# Patient Record
Sex: Male | Born: 1950 | Race: White | Hispanic: No | Marital: Married | Smoking: Former smoker
Health system: Southern US, Community
[De-identification: ages and names within clinical notes are randomized; demographics above are authoritative.]

## PROBLEM LIST (undated history)

## (undated) DIAGNOSIS — J3489 Other specified disorders of nose and nasal sinuses: Secondary | ICD-10-CM

## (undated) DIAGNOSIS — R972 Elevated prostate specific antigen [PSA]: Secondary | ICD-10-CM

## (undated) DIAGNOSIS — J189 Pneumonia, unspecified organism: Secondary | ICD-10-CM

## (undated) DIAGNOSIS — M549 Dorsalgia, unspecified: Secondary | ICD-10-CM

## (undated) DIAGNOSIS — G4730 Sleep apnea, unspecified: Secondary | ICD-10-CM

## (undated) DIAGNOSIS — I219 Acute myocardial infarction, unspecified: Secondary | ICD-10-CM

## (undated) DIAGNOSIS — M1990 Unspecified osteoarthritis, unspecified site: Secondary | ICD-10-CM

## (undated) DIAGNOSIS — G8929 Other chronic pain: Secondary | ICD-10-CM

## (undated) DIAGNOSIS — L309 Dermatitis, unspecified: Secondary | ICD-10-CM

## (undated) DIAGNOSIS — K219 Gastro-esophageal reflux disease without esophagitis: Secondary | ICD-10-CM

## (undated) DIAGNOSIS — I10 Essential (primary) hypertension: Secondary | ICD-10-CM

## (undated) DIAGNOSIS — E785 Hyperlipidemia, unspecified: Secondary | ICD-10-CM

## (undated) DIAGNOSIS — Z87898 Personal history of other specified conditions: Secondary | ICD-10-CM

## (undated) HISTORY — PX: BACK SURGERY: SHX140

## (undated) HISTORY — PX: OTHER SURGICAL HISTORY: SHX169

## (undated) HISTORY — DX: Essential (primary) hypertension: I10

## (undated) HISTORY — DX: Acute myocardial infarction, unspecified: I21.9

---

## 1993-08-21 HISTORY — PX: KNEE ARTHROSCOPY: SUR90

## 2010-11-27 ENCOUNTER — Emergency Department (INDEPENDENT_AMBULATORY_CARE_PROVIDER_SITE_OTHER): Payer: Managed Care, Other (non HMO)

## 2010-11-27 ENCOUNTER — Emergency Department (HOSPITAL_BASED_OUTPATIENT_CLINIC_OR_DEPARTMENT_OTHER)
Admission: EM | Admit: 2010-11-27 | Discharge: 2010-11-27 | Disposition: A | Discharge status: Home / Self Care | Payer: Managed Care, Other (non HMO) | Attending: Emergency Medicine | Admitting: Emergency Medicine

## 2010-11-27 DIAGNOSIS — R05 Cough: Secondary | ICD-10-CM | POA: Insufficient documentation

## 2010-11-27 DIAGNOSIS — R61 Generalized hyperhidrosis: Secondary | ICD-10-CM

## 2010-11-27 DIAGNOSIS — E7800 Pure hypercholesterolemia, unspecified: Secondary | ICD-10-CM | POA: Insufficient documentation

## 2010-11-27 DIAGNOSIS — R059 Cough, unspecified: Secondary | ICD-10-CM | POA: Insufficient documentation

## 2010-11-27 DIAGNOSIS — J40 Bronchitis, not specified as acute or chronic: Secondary | ICD-10-CM | POA: Insufficient documentation

## 2011-12-14 ENCOUNTER — Other Ambulatory Visit: Payer: Self-pay | Admitting: Otolaryngology

## 2011-12-14 ENCOUNTER — Encounter (HOSPITAL_COMMUNITY): Payer: Self-pay | Admitting: Pharmacy Technician

## 2011-12-15 ENCOUNTER — Other Ambulatory Visit (HOSPITAL_COMMUNITY): Payer: Managed Care, Other (non HMO)

## 2011-12-18 ENCOUNTER — Encounter (HOSPITAL_COMMUNITY)
Admission: RE | Admit: 2011-12-18 | Discharge: 2011-12-18 | Disposition: A | Discharge status: Home / Self Care | Payer: Managed Care, Other (non HMO) | Source: Ambulatory Visit | Attending: Otolaryngology | Admitting: Otolaryngology

## 2011-12-18 ENCOUNTER — Encounter (HOSPITAL_COMMUNITY): Payer: Self-pay

## 2011-12-18 ENCOUNTER — Ambulatory Visit (HOSPITAL_COMMUNITY)
Admission: RE | Admit: 2011-12-18 | Discharge: 2011-12-18 | Disposition: A | Discharge status: Home / Self Care | Payer: Managed Care, Other (non HMO) | Source: Ambulatory Visit | Attending: Anesthesiology | Admitting: Anesthesiology

## 2011-12-18 DIAGNOSIS — Z01818 Encounter for other preprocedural examination: Secondary | ICD-10-CM | POA: Insufficient documentation

## 2011-12-18 DIAGNOSIS — I517 Cardiomegaly: Secondary | ICD-10-CM | POA: Insufficient documentation

## 2011-12-18 DIAGNOSIS — Z01810 Encounter for preprocedural cardiovascular examination: Secondary | ICD-10-CM | POA: Insufficient documentation

## 2011-12-18 DIAGNOSIS — Z01812 Encounter for preprocedural laboratory examination: Secondary | ICD-10-CM | POA: Insufficient documentation

## 2011-12-18 HISTORY — DX: Sleep apnea, unspecified: G47.30

## 2011-12-18 HISTORY — DX: Pneumonia, unspecified organism: J18.9

## 2011-12-18 HISTORY — DX: Unspecified osteoarthritis, unspecified site: M19.90

## 2011-12-18 HISTORY — DX: Dorsalgia, unspecified: M54.9

## 2011-12-18 HISTORY — DX: Other specified disorders of nose and nasal sinuses: J34.89

## 2011-12-18 HISTORY — DX: Elevated prostate specific antigen (PSA): R97.20

## 2011-12-18 HISTORY — DX: Gastro-esophageal reflux disease without esophagitis: K21.9

## 2011-12-18 HISTORY — DX: Other chronic pain: G89.29

## 2011-12-18 HISTORY — DX: Dermatitis, unspecified: L30.9

## 2011-12-18 HISTORY — DX: Hyperlipidemia, unspecified: E78.5

## 2011-12-18 HISTORY — DX: Personal history of other specified conditions: Z87.898

## 2011-12-18 LAB — CBC
Platelets: 189 10*3/uL (ref 150–400)
RBC: 5.33 MIL/uL (ref 4.22–5.81)
WBC: 5.7 10*3/uL (ref 4.0–10.5)

## 2011-12-18 LAB — BASIC METABOLIC PANEL
GFR calc Af Amer: >90 mL/min (ref >90)
GFR calc non Af Amer: 89 mL/min — ABNORMAL LOW (ref >90)
Potassium: 4.4 mEq/L (ref 3.5–5.1)
Sodium: 141 mEq/L (ref 135–145)

## 2011-12-18 LAB — SURGICAL PCR SCREEN: MRSA, PCR: NEGATIVE

## 2011-12-18 NOTE — Pre-Procedure Instructions (Signed)
20 Tyrone Terry  12/18/2011   Your procedure is scheduled on:  Fri, May 3 @ 0730 AM  Report to [facility] at 0530 AM.  Call this number if you have problems the morning of surgery: [phone]   Remember:   Do not eat food:After Midnight.  May have clear liquids: up to 4 Hours before arrival.(until 1:30 am)  Clear liquids include soda, tea, black coffee, apple or grape juice, broth,water  Take these medicines the morning of surgery with A SIP OF WATER: Protonix and Flonase   Do not wear jewelry, make-up or nail polish.  Do not wear lotions, powders, or perfumes.  \ Do not bring valuables to the hospital.  Contacts, dentures or bridgework may not be worn into surgery.  Leave suitcase in the car. After surgery it may be brought to your room.  For patients admitted to the hospital, checkout time is 11:00 AM the day of discharge.   Patients discharged the day of surgery will not be allowed to drive home.  Please read over the following fact sheets that you were given: Pain Booklet, Coughing and Deep Breathing, MRSA Information and Surgical Site Infection Prevention

## 2011-12-18 NOTE — Progress Notes (Signed)
Pt doesn't have a cardiologist  Echo/Stress test done >37yrs ago and both were normal;was just told to loose weight  Denies ever having a heart cath

## 2011-12-18 NOTE — Progress Notes (Signed)
Sleep study done @ [facility] on [address] in [place]-report requested;pt has not heard back yet as to whether he will need CPAP

## 2011-12-19 NOTE — Consult Note (Signed)
Anesthesia Chart Review:  Patient is a 61 year old male scheduled for a nasal septoplasty and bilateral inferior turbinate reduction on 12/22/11.  History includes former smoker, HLD, migraines, chronic back pain, PNA, OSA, arthritis, eczema, elevated PSA, GERD, obesity with BMI 42.7.    Labs noted.    CXR on 12/18/11 showed lungs clear. Mediastinal contours are stable. The heart is mildly enlarged and stable. No acute bony abnormality is seen.  Sleep study on 11/29/11 at [facility] shoed mild OSA and severe snoring.  EKG from 12/18/11 showed NSR, incomplete right BBB, borderline LAD.  Currently there are no comparison EKGs available in [facility].  He reported a normal stress and echo > 5 years ago.  No CV symptoms were documented at his PAT visit. He is currently followed at [facility].  I called them, and they do no have any prior EKGs on file.  If remains asymptomatic, then anticipate he can proceed as planned.  Shonna Chock, PA-C

## 2011-12-21 NOTE — Progress Notes (Addendum)
Attempted to call pt re: surgery time change.  Left message on answering machine for pt to be here at 0730 and that procedure would be at 0930.  Attempted to call pt at work- unable to reach.//L. Delainee Tramel,RN

## 2011-12-22 ENCOUNTER — Encounter (HOSPITAL_COMMUNITY): Payer: Self-pay | Admitting: Vascular Surgery

## 2011-12-22 ENCOUNTER — Encounter (HOSPITAL_COMMUNITY)
Admission: RE | Disposition: A | Discharge status: Home / Self Care | Payer: Self-pay | Source: Ambulatory Visit | Attending: Otolaryngology

## 2011-12-22 ENCOUNTER — Encounter (HOSPITAL_COMMUNITY): Payer: Self-pay | Admitting: General Practice

## 2011-12-22 ENCOUNTER — Ambulatory Visit (HOSPITAL_COMMUNITY)
Admission: RE | Admit: 2011-12-22 | Discharge: 2011-12-22 | Disposition: A | Discharge status: Home / Self Care | Payer: Managed Care, Other (non HMO) | Source: Ambulatory Visit | Attending: Otolaryngology | Admitting: Otolaryngology

## 2011-12-22 ENCOUNTER — Ambulatory Visit (HOSPITAL_COMMUNITY): Payer: Managed Care, Other (non HMO) | Admitting: Vascular Surgery

## 2011-12-22 ENCOUNTER — Encounter (HOSPITAL_COMMUNITY): Payer: Self-pay | Admitting: Otolaryngology

## 2011-12-22 DIAGNOSIS — K219 Gastro-esophageal reflux disease without esophagitis: Secondary | ICD-10-CM | POA: Insufficient documentation

## 2011-12-22 DIAGNOSIS — J342 Deviated nasal septum: Secondary | ICD-10-CM | POA: Insufficient documentation

## 2011-12-22 DIAGNOSIS — E785 Hyperlipidemia, unspecified: Secondary | ICD-10-CM | POA: Insufficient documentation

## 2011-12-22 DIAGNOSIS — J343 Hypertrophy of nasal turbinates: Secondary | ICD-10-CM | POA: Insufficient documentation

## 2011-12-22 DIAGNOSIS — Z01812 Encounter for preprocedural laboratory examination: Secondary | ICD-10-CM | POA: Insufficient documentation

## 2011-12-22 DIAGNOSIS — G4733 Obstructive sleep apnea (adult) (pediatric): Secondary | ICD-10-CM | POA: Insufficient documentation

## 2011-12-22 HISTORY — PX: NASAL SEPTUM SURGERY: SHX37

## 2011-12-22 HISTORY — PX: NASAL SEPTOPLASTY W/ TURBINOPLASTY: SHX2070

## 2011-12-22 SURGERY — SEPTOPLASTY, NOSE, WITH NASAL TURBINATE REDUCTION
Anesthesia: General | Site: Nose | Laterality: Bilateral | Wound class: Clean Contaminated

## 2011-12-22 MED ORDER — LACTATED RINGERS IV SOLN
INTRAVENOUS | Status: DC
  Administered 2011-12-22: 09:00:00 via INTRAVENOUS

## 2011-12-22 MED ORDER — DROPERIDOL 2.5 MG/ML IJ SOLN: 0.6250 mg | INTRAMUSCULAR | Status: DC | PRN

## 2011-12-22 MED ORDER — DEXTROSE IN LACTATED RINGERS 5 % IV SOLN
INTRAVENOUS | Status: DC
  Administered 2011-12-22: 13:00:00 via INTRAVENOUS

## 2011-12-22 MED ORDER — PROPOFOL 10 MG/ML IV EMUL
INTRAVENOUS | Status: DC | PRN
  Administered 2011-12-22: 200 mg via INTRAVENOUS

## 2011-12-22 MED ORDER — 0.9 % SODIUM CHLORIDE (POUR BTL) OPTIME
TOPICAL | Status: DC | PRN
  Administered 2011-12-22: 1000 mL

## 2011-12-22 MED ORDER — PANTOPRAZOLE SODIUM 40 MG PO TBEC
40.0000 mg | DELAYED_RELEASE_TABLET | Freq: Every day | ORAL | Status: DC
  Administered 2011-12-22: 40 mg via ORAL
  Filled 2011-12-22: qty 1

## 2011-12-22 MED ORDER — CEFAZOLIN SODIUM-DEXTROSE 2-3 GM-% IV SOLR
2.0000 g | INTRAVENOUS | Status: DC
  Filled 2011-12-22: qty 50

## 2011-12-22 MED ORDER — AMOXICILLIN-POT CLAVULANATE 500-125 MG PO TABS: 1.0000 | ORAL_TABLET | Freq: Two times a day (BID) | ORAL | Status: AC

## 2011-12-22 MED ORDER — SUCCINYLCHOLINE CHLORIDE 20 MG/ML IJ SOLN
INTRAMUSCULAR | Status: DC | PRN
  Administered 2011-12-22: 140 mg via INTRAVENOUS

## 2011-12-22 MED ORDER — ONDANSETRON HCL 4 MG/2ML IJ SOLN: 4.0000 mg | INTRAMUSCULAR | Status: DC | PRN

## 2011-12-22 MED ORDER — MORPHINE SULFATE 2 MG/ML IJ SOLN: 2.0000 mg | INTRAMUSCULAR | Status: DC | PRN

## 2011-12-22 MED ORDER — DEXAMETHASONE SODIUM PHOSPHATE 4 MG/ML IJ SOLN
INTRAMUSCULAR | Status: DC | PRN
  Administered 2011-12-22: 10 mg via INTRAVENOUS

## 2011-12-22 MED ORDER — MIDAZOLAM HCL 5 MG/5ML IJ SOLN
INTRAMUSCULAR | Status: DC | PRN
  Administered 2011-12-22: 2 mg via INTRAVENOUS

## 2011-12-22 MED ORDER — ACETAMINOPHEN 160 MG/5ML PO SOLN: 650.0000 mg | ORAL | Status: DC | PRN

## 2011-12-22 MED ORDER — EPHEDRINE SULFATE 50 MG/ML IJ SOLN
INTRAMUSCULAR | Status: DC | PRN
  Administered 2011-12-22: 5 mg via INTRAVENOUS

## 2011-12-22 MED ORDER — LIDOCAINE-EPINEPHRINE 1 %-1:100000 IJ SOLN
INTRAMUSCULAR | Status: DC | PRN
  Administered 2011-12-22: 20 mL

## 2011-12-22 MED ORDER — ACETAMINOPHEN 650 MG RE SUPP: 650.0000 mg | RECTAL | Status: DC | PRN

## 2011-12-22 MED ORDER — ONDANSETRON HCL 4 MG/2ML IJ SOLN
INTRAMUSCULAR | Status: DC | PRN
  Administered 2011-12-22: 4 mg via INTRAVENOUS

## 2011-12-22 MED ORDER — HYDROCODONE-ACETAMINOPHEN 5-325 MG PO TABS: 1.0000 | ORAL_TABLET | Freq: Four times a day (QID) | ORAL | Status: AC | PRN

## 2011-12-22 MED ORDER — HYDROMORPHONE HCL PF 1 MG/ML IJ SOLN
0.2500 mg | INTRAMUSCULAR | Status: DC | PRN
  Administered 2011-12-22 (×2): 0.5 mg via INTRAVENOUS

## 2011-12-22 MED ORDER — MUPIROCIN CALCIUM 2 % EX CREA
TOPICAL_CREAM | CUTANEOUS | Status: DC | PRN
  Administered 2011-12-22: 1 via TOPICAL

## 2011-12-22 MED ORDER — OXYMETAZOLINE HCL 0.05 % NA SOLN
NASAL | Status: DC | PRN
  Administered 2011-12-22: 1

## 2011-12-22 MED ORDER — DEXAMETHASONE SODIUM PHOSPHATE 10 MG/ML IJ SOLN
10.0000 mg | Freq: Once | INTRAMUSCULAR | Status: DC
  Filled 2011-12-22: qty 1

## 2011-12-22 MED ORDER — HYDROCODONE-ACETAMINOPHEN 5-325 MG PO TABS: 1.0000 | ORAL_TABLET | ORAL | Status: DC | PRN

## 2011-12-22 MED ORDER — LACTATED RINGERS IV SOLN
INTRAVENOUS | Status: DC | PRN
  Administered 2011-12-22: 09:00:00 via INTRAVENOUS

## 2011-12-22 MED ORDER — ONDANSETRON HCL 4 MG PO TABS: 4.0000 mg | ORAL_TABLET | ORAL | Status: DC | PRN

## 2011-12-22 MED ORDER — FENTANYL CITRATE 0.05 MG/ML IJ SOLN
INTRAMUSCULAR | Status: DC | PRN
  Administered 2011-12-22: 150 ug via INTRAVENOUS

## 2011-12-22 MED ORDER — CEFAZOLIN SODIUM 1-5 GM-% IV SOLN: 1.0000 g | INTRAVENOUS | Status: DC

## 2011-12-22 SURGICAL SUPPLY — 30 items
CANISTER SUCTION 2500CC (MISCELLANEOUS) ×2 IMPLANT
CLOTH BEACON ORANGE TIMEOUT ST (SAFETY) ×2 IMPLANT
COAGULATOR SUCT SWTCH 10FR 6 (ELECTROSURGICAL) IMPLANT
COVER MAYO STAND STRL (DRAPES) ×2 IMPLANT
ELECT LOOP CUT MONO 26F .012 R (MISCELLANEOUS) ×2 IMPLANT
ELECT REM PT RETURN 9FT ADLT (ELECTROSURGICAL)
ELECTRODE REM PT RTRN 9FT ADLT (ELECTROSURGICAL) IMPLANT
GAUZE SPONGE 2X2 8PLY STRL LF (GAUZE/BANDAGES/DRESSINGS) ×1 IMPLANT
GLOVE BIOGEL M 7.0 STRL (GLOVE) ×4 IMPLANT
GLOVE SURG SS PI 6.5 STRL IVOR (GLOVE) ×2 IMPLANT
GOWN STRL NON-REIN LRG LVL3 (GOWN DISPOSABLE) ×6 IMPLANT
KIT BASIN OR (CUSTOM PROCEDURE TRAY) ×2 IMPLANT
KIT ROOM TURNOVER OR (KITS) ×2 IMPLANT
NEEDLE HYPO 25GX1X1/2 BEV (NEEDLE) ×2 IMPLANT
NS IRRIG 1000ML POUR BTL (IV SOLUTION) ×2 IMPLANT
PAD ARMBOARD 7.5X6 YLW CONV (MISCELLANEOUS) ×4 IMPLANT
SPLINT NASAL DOYLE BI-VL (GAUZE/BANDAGES/DRESSINGS) ×2 IMPLANT
SPONGE GAUZE 2X2 STER 10/PKG (GAUZE/BANDAGES/DRESSINGS) ×1
SPONGE NEURO XRAY DETECT 1X3 (DISPOSABLE) ×2 IMPLANT
SUT CHROMIC 4 0 P 3 18 (SUTURE) ×2 IMPLANT
SUT ETHILON 3 0 PS 1 (SUTURE) ×2 IMPLANT
SUT PLAIN 4 0 ~~LOC~~ 1 (SUTURE) ×2 IMPLANT
TAPE SURG TRANSPORE 1 IN (GAUZE/BANDAGES/DRESSINGS) ×1 IMPLANT
TAPE SURGICAL TRANSPORE 1 IN (GAUZE/BANDAGES/DRESSINGS) ×1
TOWEL OR 17X24 6PK STRL BLUE (TOWEL DISPOSABLE) ×2 IMPLANT
TOWEL OR 17X26 10 PK STRL BLUE (TOWEL DISPOSABLE) ×2 IMPLANT
TRAY ENT MC OR (CUSTOM PROCEDURE TRAY) ×2 IMPLANT
TUBE SALEM SUMP 16 FR W/ARV (TUBING) ×2 IMPLANT
TUBING EXTENTION W/L.L. (IV SETS) ×2 IMPLANT
WATER STERILE IRR 1000ML POUR (IV SOLUTION) IMPLANT

## 2011-12-22 NOTE — Discharge Planning (Signed)
D'cd per w/c acomp. By wife with all personal belongings and copy of home instructions and prescritions.

## 2011-12-22 NOTE — H&P (Signed)
Tyrone Terry is an 61 y.o. male.   Chief Complaint: Nasal airway obstruction HPI: Mild OSA, progressive nasal obstruction  Past Medical History  Diagnosis Date  . Hyperlipidemia     takes [place] daily  . Pneumonia     hx of;last time about 3-72yrs ago  . Sinus drainage     uses Flonase nightly as well as saline spray  . Sleep apnea     has not gotten report back from this  . Migraine     hx of;last time a couple of yrs ago  . Arthritis     back   . Chronic back pain     degenerative disc disease  . Eczema   . GERD (gastroesophageal reflux disease)     takes Protonix daily  . Hemorrhoids   . Elevated PSA     sees urologist  . H/O urinary frequency     Past Surgical History  Procedure Date  . Knee arthroscopy 1995    left  . Colonosocpy     Family History  Problem Relation Age of Onset  . Anesthesia problems Neg Hx   . Hypotension Neg Hx   . Malignant hyperthermia Neg Hx   . Pseudochol deficiency Neg Hx    Social History:  reports that he has quit smoking. He does not have any smokeless tobacco history on file. He reports that he drinks alcohol. He reports that he does not use illicit drugs.  Allergies:  Allergies  Allergen Reactions  . Statins Other (See Comments)    Muscle cramps    Medications Prior to Admission  Medication Sig Dispense Refill  . Acetaminophen (TYLENOL PO) Take 1 tablet by mouth every 6 (six) hours as needed. For pain      . B Complex-C (B-COMPLEX WITH VITAMIN C) tablet Take 1 tablet by mouth daily.      . Cholecalciferol (VITAMIN D3) 3000 UNITS TABS Take 1 tablet by mouth daily.      . fluticasone (FLONASE) 50 MCG/ACT nasal spray Place 2 sprays into the nose at bedtime.      . Omega-3 Krill Oil 500 MG CAPS Take 1 capsule by mouth daily.      . pantoprazole (PROTONIX) 40 MG tablet Take 40 mg by mouth daily.      . Red Yeast Rice 600 MG CAPS Take 1 capsule by mouth daily.      . sodium chloride (OCEAN) 0.65 % SOLN nasal spray Place  1 spray into the nose every evening.        No results found for this or any previous visit (from the past 48 hour(s)). No results found.  Review of Systems  Constitutional: Negative.   Respiratory: Negative.   Cardiovascular: Negative.   Gastrointestinal: Negative.   Musculoskeletal: Negative.   Skin: Negative.   Neurological: Negative.     There were no vitals taken for this visit. Physical Exam  Constitutional: He is oriented to person, place, and time. He appears well-developed and well-nourished.  HENT:  Nose: Septal deviation present.  Mouth/Throat: Uvula is midline, oropharynx is clear and moist and mucous membranes are normal.  Neck: Normal range of motion. Neck supple.  Cardiovascular: Normal rate and regular rhythm.   Respiratory: Effort normal and breath sounds normal.  GI: Soft.  Musculoskeletal: Normal range of motion.  Neurological: He is alert and oriented to person, place, and time.     Assessment/Plan ADM for nasal septoplasty and turbinate reduction  Britiany Silbernagel 12/22/2011,  7:41 AM

## 2011-12-22 NOTE — Brief Op Note (Signed)
12/22/2011  10:17 AM  PATIENT:  Tyrone Terry  61 y.o. male  PRE-OPERATIVE DIAGNOSIS:  DEVIATED SEPTUM  POST-OPERATIVE DIAGNOSIS:  DEVIATED SEPTUM  PROCEDURE:  Procedure(s) (LRB): NASAL SEPTOPLASTY WITH TURBINATE REDUCTION (Bilateral)  SURGEON:  Surgeon(s) and Role:    * Osborn Coho, MD - Primary  PHYSICIAN ASSISTANT:   ASSISTANTS: none   ANESTHESIA:   general  EBL:   <50  BLOOD ADMINISTERED:none  DRAINS: none   LOCAL MEDICATIONS USED:  LIDOCAINE  and Amount: 8 ml  SPECIMEN:  No Specimen  DISPOSITION OF SPECIMEN:  N/A  COUNTS:  YES  TOURNIQUET:  * No tourniquets in log *  DICTATION: .Other Dictation: Dictation Number [id number]  PLAN OF CARE: Admit for overnight observation  PATIENT DISPOSITION:  PACU - hemodynamically stable.   Delay start of Pharmacological VTE agent (>24hrs) due to surgical blood loss or risk of bleeding: yes

## 2011-12-22 NOTE — Transfer of Care (Signed)
Immediate Anesthesia Transfer of Care Note  Patient: Tyrone Terry  Procedure(s) Performed: Procedure(s) (LRB): NASAL SEPTOPLASTY WITH TURBINATE REDUCTION (Bilateral)  Patient Location: PACU  Anesthesia Type: General  Level of Consciousness: awake and alert   Airway & Oxygen Therapy: Patient Spontanous Breathing and Patient connected to nasal cannula oxygen  Post-op Assessment: Report given to PACU RN and Post -op Vital signs reviewed and stable  Post vital signs: Reviewed and stable  Complications: No apparent anesthesia complications

## 2011-12-22 NOTE — Discharge Summary (Signed)
Physician Discharge Summary  Patient ID: AIVEN KAMPE MRN: [id number] DOB/AGE: Sep 28, 1950 61 y.o.  Admit date: 12/22/2011 Discharge date: 12/22/2011  Admission Diagnoses: Septal deviation  Discharge Diagnoses:  Principal Problem:  *Nasal septal deviation   Discharged Condition: good  Hospital Course: 61 year old male with mild sleep apnea who underwent nasal septoplasty and turbinate reduction earlier today.  Doing well after surgery with only mild pain and decreasing nasal bleeding.  Stable for discharge.  Consults: None  Significant Diagnostic Studies: None.  Treatments: surgery: Nasal septoplasty and turbinate reduction.  Discharge Exam: Blood pressure 129/77, pulse 99, temperature 98.2 F (36.8 C), temperature source Oral, resp. rate 18, height 5\' 6"  (1.676 m), weight 120 kg (264 lb 8.8 oz), SpO2 93.00%. General appearance: alert, cooperative and no distress Nose: Nasal drip pad in place.  No active bleeding.  Disposition: 01-Home or Self Care  Discharge Orders    Future Orders Please Complete By Expires   Diet - low sodium heart healthy      Diet - low sodium heart healthy      Increase activity slowly      Discharge instructions      Comments:   1. Limited activity 2. Liquid and soft diet 3. May bathe and shower 4. Saline nasal spray - 4 puffs/nostril every hour while awake 5. Elevate Head of Bed 6. No nose blowing   Increase activity slowly      Discharge instructions      Comments:   Avoid nose blowing.  Keep head elevated.  Use saline spray in each nostril each 2-4 hours.  Change drip pad as needed.  Keep activity level limited.     Medication List  As of 12/22/2011  4:42 PM   STOP taking these medications         fluticasone 50 MCG/ACT nasal spray         TAKE these medications         amoxicillin-clavulanate 500-125 MG per tablet   Commonly known as: AUGMENTIN   Take 1 tablet (500 mg total) by mouth 2 (two) times daily.      B-complex with  vitamin C tablet   Take 1 tablet by mouth daily.      HYDROcodone-acetaminophen 5-325 MG per tablet   Commonly known as: NORCO   Take 1-2 tablets by mouth every 6 (six) hours as needed for pain.      Omega-3 Krill Oil 500 MG Caps   Take 1 capsule by mouth daily.      pantoprazole 40 MG tablet   Commonly known as: PROTONIX   Take 40 mg by mouth daily.      Red Yeast Rice 600 MG Caps   Take 1 capsule by mouth daily.      sodium chloride 0.65 % Soln nasal spray   Commonly known as: OCEAN   Place 1 spray into the nose every evening.      TYLENOL PO   Take 1 tablet by mouth every 6 (six) hours as needed. For pain      Vitamin D3 3000 UNITS Tabs   Take 1 tablet by mouth daily.           Follow-up Information    Follow up with SHOEMAKER, DAVID, MD in 2 weeks.   Contact information:   [address], [address] [place] [address] [phone]          Signed: Jenne Pane, Sedra Morfin  12/22/2011, 4:42 PM

## 2011-12-22 NOTE — Op Note (Signed)
Tyrone Terry, Tyrone Terry            ACCOUNT NO.:  [id number]  MEDICAL RECORD NO.:  [id number]  LOCATION:  [facility]                         FACILITY:  [facility]  PHYSICIAN:  Kinnie Scales. Annalee Genta, M.D.DATE OF BIRTH:  09-Sep-1950  DATE OF PROCEDURE:  12/22/2011 DATE OF DISCHARGE:                              OPERATIVE REPORT   LOCATION:  [facility] Main OR.  PREOPERATIVE DIAGNOSES: 1. Deviated nasal septum with airway obstruction. 2. Inferior turbinate hypertrophy. 3. Obstructive sleep apnea.  POSTOPERATIVE DIAGNOSES: 1. Deviated nasal septum with airway obstruction. 2. Inferior turbinate hypertrophy. 3. Obstructive sleep apnea.  INDICATION FOR SURGERY: 1. Deviated nasal septum with airway obstruction. 2. Inferior turbinate hypertrophy. 3. Obstructive sleep apnea.  SURGICAL PROCEDURE PERFORMED: 1. Nasal septoplasty. 2. Inferior turbinate reduction.  ANESTHESIA:  General endotracheal.  COMPLICATIONS:  None.  ESTIMATED BLOOD LOSS:  Less than 50 mL.  The patient was transferred from the operating room to the recovery room in stable condition.  BRIEF HISTORY:  The patient is a 61 year old, white male, referred to our office for evaluation of progressive symptoms of nasal airway obstruction.  He suffered a previous nasal injury without treatment several decades ago, and since that time, he has noted increasing problems with nasal breathing, particularly on the left-hand side. Examination in the office showed a severely deviated nasal septum with near complete left-sided nasal airway obstruction and bilateral turbinate hypertrophy.  The patient also had significant nighttime snoring and concerns with sleep apnea.  A sleep study was performed, and the patient was found to have mild sleep apnea with an AHI of 10.7 events per hour.  Given the patient's history, examination, and physical findings, I recommended to undertake nasal septoplasty and inferior turbinate reduction.  The  risks and benefits of the procedure were discussed in detail with the patient and his family.  They understood and concurred with our plan for surgery which is scheduled as an outpatient under general anesthesia at [facility] Main OR.  PROCEDURE:  The patient was brought to the operating room, placed in supine position on the operating table.  General endotracheal anesthesia was established without difficulty, and when he was adequately anesthetized, he was positioned and then prepped and draped in a sterile fashion.  His nose was injected with a total of 8 mL of 1% lidocaine 1:100,000 solution of epinephrine was injected in submucosal fashion along the nasal septum and inferior turbinates bilaterally.  The patient's nose was then packed with Afrin-soaked cottonoid pledgets, were left in place for approximately 10 minutes to allow for vasoconstriction.  The patient was positioned, prepped and draped. Procedure was begun.  A left anterior hemitransfixion incision was created and mucoperichondrial flap was elevated on the left-hand side. Cartilaginous septum was crossed anteriorly and a mucoperichondrial flap was elevated on the patient's right.  He had a severely deviated nasal septum with significant traumatic changes and a large inferior septal spur which was mobilized preserving the overlying mucosa.  The septum was brought to a good midline position and mid and posterior septal cartilage and bone were resected.  A small amount of septal cartilage was morselized and returned to the mucoperichondrial pocket.  Mucosal flaps were reapproximated with a 4-0  gut suture on a Keith needle in a horizontal mattressing fashion.  Bilateral Doyle nasal septal splints were placed after the application of Bactroban ointment, and the splints were sutured in position with a 3-0 Ethilon suture.  Inferior turbinate reduction was also performed with bipolar cautery set at 12 watts, 2  submucosal passes were made in each inferior turbinate. When the turbinate has been adequately cauterized, an anterior incision was created overlying soft tissue elevated and the turbinates were then outfractured creating more patent nasal cavity.  Nasal cavity was irrigated and suctioned, no bleeding.  Orogastric tube was passed.  Stomach contents were aspirated.  The patient was then awakened from his anesthetic, he was extubated and transferred from the operating room to the recovery room in stable condition.  No complications.  Blood loss less than 50 mL.          ______________________________ Kinnie Scales. Annalee Genta, M.D.     DLS/MEDQ  D:  16/05/9603  T:  12/22/2011  Job:  [id number]

## 2011-12-22 NOTE — Anesthesia Preprocedure Evaluation (Addendum)
Anesthesia Evaluation  Patient identified by MRN, date of birth, ID band Patient awake    Reviewed: Allergy & Precautions, H&P , NPO status , Patient's Chart, lab work & pertinent test results  History of Anesthesia Complications Negative for: history of anesthetic complications  Airway Mallampati: II TM Distance: >3 FB Neck ROM: Full    Dental  (+) Teeth Intact and Dental Advisory Given   Pulmonary sleep apnea ,  breath sounds clear to auscultation  Pulmonary exam normal       Cardiovascular negative cardio ROS  Rhythm:Regular Rate:Normal     Neuro/Psych negative psych ROS   GI/Hepatic Neg liver ROS, GERD-  Medicated and Controlled,  Endo/Other  Morbid obesity  Renal/GU negative Renal ROS     Musculoskeletal   Abdominal   Peds  Hematology   Anesthesia Other Findings   Reproductive/Obstetrics                           Anesthesia Physical Anesthesia Plan  ASA: III  Anesthesia Plan: General   Post-op Pain Management:    Induction: Intravenous  Airway Management Planned: Oral ETT  Additional Equipment:   Intra-op Plan:   Post-operative Plan:   Informed Consent: I have reviewed the patients History and Physical, chart, labs and discussed the procedure including the risks, benefits and alternatives for the proposed anesthesia with the patient or authorized representative who has indicated his/her understanding and acceptance.     Plan Discussed with: CRNA, Anesthesiologist and Surgeon  Anesthesia Plan Comments:         Anesthesia Quick Evaluation

## 2011-12-25 ENCOUNTER — Encounter (HOSPITAL_COMMUNITY): Payer: Self-pay | Admitting: Otolaryngology

## 2012-01-08 NOTE — Anesthesia Postprocedure Evaluation (Signed)
Anesthesia Post Note  Patient: Tyrone Terry  Procedure(s) Performed: Procedure(s) (LRB): NASAL SEPTOPLASTY WITH TURBINATE REDUCTION (Bilateral)  Anesthesia type: general  Patient location: PACU  Post pain: Pain level controlled  Post assessment: Patient's Cardiovascular Status Stable  Last Vitals:  Filed Vitals:   12/22/11 1402  BP: 129/77  Pulse: 99  Temp: 36.8 C  Resp: 18    Post vital signs: Reviewed and stable  Level of consciousness: sedated  Complications: No apparent anesthesia complications

## 2014-01-07 ENCOUNTER — Other Ambulatory Visit (HOSPITAL_BASED_OUTPATIENT_CLINIC_OR_DEPARTMENT_OTHER): Payer: Self-pay | Admitting: Sports Medicine

## 2014-01-07 DIAGNOSIS — M545 Low back pain, unspecified: Secondary | ICD-10-CM

## 2014-01-07 DIAGNOSIS — M47817 Spondylosis without myelopathy or radiculopathy, lumbosacral region: Secondary | ICD-10-CM

## 2014-01-07 DIAGNOSIS — M5137 Other intervertebral disc degeneration, lumbosacral region: Secondary | ICD-10-CM

## 2014-01-07 DIAGNOSIS — IMO0002 Reserved for concepts with insufficient information to code with codable children: Secondary | ICD-10-CM

## 2014-01-07 DIAGNOSIS — G4730 Sleep apnea, unspecified: Secondary | ICD-10-CM

## 2014-01-07 DIAGNOSIS — K219 Gastro-esophageal reflux disease without esophagitis: Secondary | ICD-10-CM

## 2014-01-10 ENCOUNTER — Ambulatory Visit (HOSPITAL_BASED_OUTPATIENT_CLINIC_OR_DEPARTMENT_OTHER): Payer: Managed Care, Other (non HMO)

## 2014-02-01 ENCOUNTER — Encounter (HOSPITAL_BASED_OUTPATIENT_CLINIC_OR_DEPARTMENT_OTHER): Payer: Self-pay | Admitting: Emergency Medicine

## 2014-02-01 ENCOUNTER — Emergency Department (HOSPITAL_BASED_OUTPATIENT_CLINIC_OR_DEPARTMENT_OTHER): Payer: Managed Care, Other (non HMO)

## 2014-02-01 ENCOUNTER — Emergency Department (HOSPITAL_BASED_OUTPATIENT_CLINIC_OR_DEPARTMENT_OTHER)
Admission: EM | Admit: 2014-02-01 | Discharge: 2014-02-01 | Disposition: A | Discharge status: Home / Self Care | Payer: Managed Care, Other (non HMO) | Attending: Emergency Medicine | Admitting: Emergency Medicine

## 2014-02-01 DIAGNOSIS — R079 Chest pain, unspecified: Secondary | ICD-10-CM

## 2014-02-01 DIAGNOSIS — R0789 Other chest pain: Secondary | ICD-10-CM | POA: Insufficient documentation

## 2014-02-01 DIAGNOSIS — K219 Gastro-esophageal reflux disease without esophagitis: Secondary | ICD-10-CM | POA: Insufficient documentation

## 2014-02-01 DIAGNOSIS — E7800 Pure hypercholesterolemia, unspecified: Secondary | ICD-10-CM | POA: Insufficient documentation

## 2014-02-01 DIAGNOSIS — M129 Arthropathy, unspecified: Secondary | ICD-10-CM | POA: Insufficient documentation

## 2014-02-01 DIAGNOSIS — R51 Headache: Secondary | ICD-10-CM | POA: Insufficient documentation

## 2014-02-01 DIAGNOSIS — R519 Headache, unspecified: Secondary | ICD-10-CM

## 2014-02-01 DIAGNOSIS — Z79899 Other long term (current) drug therapy: Secondary | ICD-10-CM | POA: Insufficient documentation

## 2014-02-01 DIAGNOSIS — Z87891 Personal history of nicotine dependence: Secondary | ICD-10-CM | POA: Insufficient documentation

## 2014-02-01 DIAGNOSIS — Z872 Personal history of diseases of the skin and subcutaneous tissue: Secondary | ICD-10-CM | POA: Insufficient documentation

## 2014-02-01 DIAGNOSIS — Z8701 Personal history of pneumonia (recurrent): Secondary | ICD-10-CM | POA: Insufficient documentation

## 2014-02-01 DIAGNOSIS — G43909 Migraine, unspecified, not intractable, without status migrainosus: Secondary | ICD-10-CM | POA: Insufficient documentation

## 2014-02-01 DIAGNOSIS — E785 Hyperlipidemia, unspecified: Secondary | ICD-10-CM | POA: Insufficient documentation

## 2014-02-01 DIAGNOSIS — N390 Urinary tract infection, site not specified: Secondary | ICD-10-CM | POA: Insufficient documentation

## 2014-02-01 DIAGNOSIS — Z791 Long term (current) use of non-steroidal anti-inflammatories (NSAID): Secondary | ICD-10-CM | POA: Insufficient documentation

## 2014-02-01 DIAGNOSIS — E669 Obesity, unspecified: Secondary | ICD-10-CM | POA: Insufficient documentation

## 2014-02-01 DIAGNOSIS — Z8679 Personal history of other diseases of the circulatory system: Secondary | ICD-10-CM | POA: Insufficient documentation

## 2014-02-01 DIAGNOSIS — G8929 Other chronic pain: Secondary | ICD-10-CM | POA: Insufficient documentation

## 2014-02-01 LAB — CBC WITH DIFFERENTIAL/PLATELET
BASOS PCT: 0 % (ref 0–1)
Basophils Absolute: 0 10*3/uL (ref 0.0–0.1)
EOS ABS: 0.1 10*3/uL (ref 0.0–0.7)
Eosinophils Relative: 0 % (ref 0–5)
HCT: 44.3 % (ref 39.0–52.0)
HEMOGLOBIN: 15.9 g/dL (ref 13.0–17.0)
Lymphocytes Relative: 7 % — ABNORMAL LOW (ref 12–46)
Lymphs Abs: 1.1 10*3/uL (ref 0.7–4.0)
MCH: 31.1 pg (ref 26.0–34.0)
MCHC: 35.9 g/dL (ref 30.0–36.0)
MCV: 86.7 fL (ref 78.0–100.0)
MONOS PCT: 8 % (ref 3–12)
Monocytes Absolute: 1.4 10*3/uL — ABNORMAL HIGH (ref 0.1–1.0)
NEUTROS ABS: 14 10*3/uL — AB (ref 1.7–7.7)
NEUTROS PCT: 85 % — AB (ref 43–77)
PLATELETS: 158 10*3/uL (ref 150–400)
RBC: 5.11 MIL/uL (ref 4.22–5.81)
RDW: 13.6 % (ref 11.5–15.5)
WBC: 16.5 10*3/uL — ABNORMAL HIGH (ref 4.0–10.5)

## 2014-02-01 LAB — URINALYSIS, ROUTINE W REFLEX MICROSCOPIC
Bilirubin Urine: NEGATIVE
GLUCOSE, UA: NEGATIVE mg/dL
Hgb urine dipstick: NEGATIVE
Ketones, ur: 15 mg/dL — AB
Nitrite: POSITIVE — AB
PH: 8 (ref 5.0–8.0)
Protein, ur: NEGATIVE mg/dL
SPECIFIC GRAVITY, URINE: 1.017 (ref 1.005–1.030)
Urobilinogen, UA: 0.2 mg/dL (ref 0.0–1.0)

## 2014-02-01 LAB — COMPREHENSIVE METABOLIC PANEL
ALT: 23 U/L (ref 0–53)
AST: 18 U/L (ref 0–37)
Albumin: 4 g/dL (ref 3.5–5.2)
Alkaline Phosphatase: 77 U/L (ref 39–117)
BUN: 12 mg/dL (ref 6–23)
CO2: 24 mEq/L (ref 19–32)
Calcium: 9.9 mg/dL (ref 8.4–10.5)
Chloride: 97 mEq/L (ref 96–112)
Creatinine, Ser: 0.8 mg/dL (ref 0.50–1.35)
GFR calc Af Amer: >90 mL/min (ref >90)
GFR calc non Af Amer: >90 mL/min (ref >90)
Glucose, Bld: 112 mg/dL — ABNORMAL HIGH (ref 70–99)
Potassium: 3.9 mEq/L (ref 3.7–5.3)
SODIUM: 136 meq/L — AB (ref 137–147)
TOTAL PROTEIN: 7.5 g/dL (ref 6.0–8.3)
Total Bilirubin: 1.8 mg/dL — ABNORMAL HIGH (ref 0.3–1.2)

## 2014-02-01 LAB — D-DIMER, QUANTITATIVE: D-Dimer, Quant: 0.36 ug/mL-FEU (ref 0.00–0.48)

## 2014-02-01 LAB — URINE MICROSCOPIC-ADD ON

## 2014-02-01 LAB — TROPONIN I

## 2014-02-01 MED ORDER — CEPHALEXIN 500 MG PO CAPS: 500.0000 mg | ORAL_CAPSULE | Freq: Four times a day (QID) | ORAL | Status: DC

## 2014-02-01 MED ORDER — DIPHENHYDRAMINE HCL 50 MG/ML IJ SOLN
25.0000 mg | Freq: Once | INTRAMUSCULAR | Status: AC
  Administered 2014-02-01: 25 mg via INTRAVENOUS
  Filled 2014-02-01: qty 1

## 2014-02-01 MED ORDER — DEXTROSE 5 % IV SOLN: 1.0000 g | Freq: Once | INTRAVENOUS | Status: DC

## 2014-02-01 MED ORDER — CEFTRIAXONE SODIUM 1 G IJ SOLR
1.0000 g | Freq: Once | INTRAMUSCULAR | Status: AC
Start: 2014-02-01 — End: 2014-02-01
  Administered 2014-02-01: 1 g via INTRAVENOUS

## 2014-02-01 MED ORDER — CEFTRIAXONE SODIUM 1 G IJ SOLR
INTRAMUSCULAR | Status: AC
  Filled 2014-02-01: qty 10

## 2014-02-01 MED ORDER — SODIUM CHLORIDE 0.9 % IV BOLUS (SEPSIS)
1000.0000 mL | INTRAVENOUS | Status: AC
  Administered 2014-02-01: 1000 mL via INTRAVENOUS

## 2014-02-01 MED ORDER — METOCLOPRAMIDE HCL 5 MG/ML IJ SOLN
10.0000 mg | Freq: Once | INTRAMUSCULAR | Status: AC
  Administered 2014-02-01: 10 mg via INTRAVENOUS
  Filled 2014-02-01: qty 2

## 2014-02-01 MED ORDER — ACETAMINOPHEN 325 MG PO TABS
650.0000 mg | ORAL_TABLET | Freq: Once | ORAL | Status: AC
  Administered 2014-02-01: 650 mg via ORAL
  Filled 2014-02-01: qty 2

## 2014-02-01 NOTE — ED Provider Notes (Signed)
CSN: [id number]     Arrival date & time 02/01/14  1420 History   This chart was scribed for Junius ArgyleForrest S Placido Hangartner, MD by Quintella ReichertMatthew Underwood, ED scribe.  This patient was seen in room MH05/MH05 and the patient's care was started at 3:39 PM.     Chief Complaint  Patient presents with  . Chest Pain    Patient is a 63 y.o. male presenting with chest pain. The history is provided by the patient. No language interpreter was used.  Chest Pain Pain quality: pressure   Pain radiates to:  Upper back Pain radiates to the back: yes   Pain severity:  Moderate Duration:  17 hours Timing:  Intermittent Progression:  Resolved Chronicity:  New Context: at rest   Worsened by:  Deep breathing Associated symptoms: fever, headache and nausea   Associated symptoms: no abdominal pain, no back pain, no cough, no shortness of breath and not vomiting   Risk factors: high cholesterol, male sex and obesity   Risk factors: no coronary artery disease, no diabetes mellitus, no hypertension and no prior DVT/PE    HPI Comments: Tyrone Terry is a 63 y.o. male with a history of migraines who presents to the Emergency Department complaining of a gradual onset headache beginning yesterday. Patient reports that he was sitting at home when the pain began and currently rates the pain as a 10/10 in severity. Describes pain as dull and throbbing at the left occipital region. He has taken two OTC ibuprofen for the pain today without relief. He states that this headache feels similar to previous migraines. He also reports associated nausea, but denies vomiting.   He also reports a sharp pain at the end of his penis while urinating yesterday and continued discomfort while urinating today. He denies any pain when not urinating. He denies associated abdominal pain. He states that his urine was a dark yellow.  He believes that he passed a kidney stone.   Patient also reports chest pain that radiates to his upper back. The pain  began last night around 11:30 PM while he was laying in bed. He is not currently experiencing any pain. He described the pain as a pressure that is exacerbated by breathing. He reports that the pain was intermittent and lasted for a couple minutes at a time, with episodes occurring approximately every hour. Last night, the patient went out to dinner and reports eating more than he normally eats. He also reports feeling generally ill since waking this morning. Patient has a history of hyperlipidemia but denies denies HTN or DM. He has a history of smoking but quit 27 years ago. He denies recent surgery. He drinks alcohol rarely.   Past Medical History  Diagnosis Date  . Hyperlipidemia     takes [place] Oil daily  . Pneumonia     hx of;last time about 3-6619yrs ago  . Sinus drainage     uses Flonase nightly as well as saline spray  . Sleep apnea     has not gotten report back from this  . Migraine     hx of;last time a couple of yrs ago  . Arthritis     back   . Chronic back pain     degenerative disc disease  . Eczema   . GERD (gastroesophageal reflux disease)     takes Protonix daily  . Hemorrhoids   . Elevated PSA     sees urologist  . H/O urinary frequency    Past  Surgical History  Procedure Laterality Date  . Knee arthroscopy  1995    left  . Colonosocpy    . Nasal septum surgery  12/22/2011  . Nasal septoplasty w/ turbinoplasty  12/22/2011    Procedure: NASAL SEPTOPLASTY WITH TURBINATE REDUCTION;  Surgeon: Osborn Coho, MD;  Location: [facility] OR;  Service: ENT;  Laterality: Bilateral;   Family History  Problem Relation Age of Onset  . Anesthesia problems Neg Hx   . Hypotension Neg Hx   . Malignant hyperthermia Neg Hx   . Pseudochol deficiency Neg Hx    History  Substance Use Topics  . Smoking status: Former Games developer  . Smokeless tobacco: Never Used     Comment: quit 51yrs ago  . Alcohol Use: Yes     Comment: 3 days/wk    Review of Systems  Constitutional: Positive for  fever. Negative for chills.  HENT: Negative for congestion, rhinorrhea and sore throat.   Eyes: Negative for visual disturbance.  Respiratory: Negative for cough and shortness of breath.   Cardiovascular: Positive for chest pain. Negative for leg swelling.  Gastrointestinal: Positive for nausea. Negative for vomiting and abdominal pain.  Genitourinary: Positive for dysuria.  Musculoskeletal: Negative for back pain and neck pain.  Skin: Negative for rash.  Neurological: Positive for headaches.  Hematological: Does not bruise/bleed easily.  Psychiatric/Behavioral: Negative for confusion.      Allergies  Statins  Home Medications   Prior to Admission medications   Medication Sig Start Date End Date Taking? Authorizing Provider  gabapentin (NEURONTIN) 300 MG capsule Take 300 mg by mouth 3 (three) times daily.   Yes Historical Provider, MD  meloxicam (MOBIC) 15 MG tablet Take 15 mg by mouth daily.   Yes Historical Provider, MD  Acetaminophen (TYLENOL PO) Take 1 tablet by mouth every 6 (six) hours as needed. For pain    Historical Provider, MD  B Complex-C (B-COMPLEX WITH VITAMIN C) tablet Take 1 tablet by mouth daily.    Historical Provider, MD  Cholecalciferol (VITAMIN D3) 3000 UNITS TABS Take 1 tablet by mouth daily.    Historical Provider, MD  Omega-3 Krill Oil 500 MG CAPS Take 1 capsule by mouth daily.    Historical Provider, MD  pantoprazole (PROTONIX) 40 MG tablet Take 40 mg by mouth daily.    Historical Provider, MD  Red Yeast Rice 600 MG CAPS Take 1 capsule by mouth daily.    Historical Provider, MD  sodium chloride (OCEAN) 0.65 % SOLN nasal spray Place 1 spray into the nose every evening.    Historical Provider, MD   BP 167/80  Pulse 102  Temp(Src) 100.7 F (38.2 C) (Oral)  Resp 18  Ht 5\' 6"  (1.676 m)  Wt 274 lb (124.286 kg)  BMI 44.25 kg/m2  SpO2 95%  Physical Exam  Nursing note and vitals reviewed. Constitutional: He is oriented to person, place, and time. He  appears well-developed and well-nourished. No distress.  HENT:  Head: Normocephalic and atraumatic.  Mouth/Throat: Oropharynx is clear and moist. No oropharyngeal exudate.  Eyes: Conjunctivae and EOM are normal. Pupils are equal, round, and reactive to light.  Neck: Normal range of motion. Neck supple. No tracheal deviation present.  Cardiovascular: Normal rate, regular rhythm and normal heart sounds.  Exam reveals no gallop and no friction rub.   No murmur heard. Pulmonary/Chest: Effort normal and breath sounds normal. No respiratory distress. He has no wheezes. He has no rales.  Abdominal: Soft. Bowel sounds are normal. He exhibits no distension  and no mass. There is no tenderness. There is no rebound and no guarding.  Musculoskeletal: Normal range of motion. He exhibits no edema.  Neurological: He is alert and oriented to person, place, and time. No cranial nerve deficit.  alert, oriented x3 speech: normal in context and clarity memory: intact grossly cranial nerves II-XII: intact motor strength: full proximally and distally no involuntary movements or tremors sensation: intact to light touch diffusely  cerebellar: finger-to-nose and heel-to-shin intact gait: normal forwards and backwards   Skin: Skin is warm and dry.  Psychiatric: He has a normal mood and affect. His behavior is normal.    ED Course  Procedures (including critical care time) DIAGNOSTIC STUDIES: Oxygen Saturation is 95% on room air, adequate by my interpretation.    COORDINATION OF CARE: 3:45 PM-Discussed treatment plan which includes CXR, labs, Reglan, Benadryl and Tylenol with pt at bedside and pt agreed to plan.     Labs Review Labs Reviewed  URINALYSIS, ROUTINE W REFLEX MICROSCOPIC - Abnormal; Notable for the following:    APPearance CLOUDY (*)    Ketones, ur 15 (*)    Nitrite POSITIVE (*)    Leukocytes, UA MODERATE (*)    All other components within normal limits  CBC WITH DIFFERENTIAL - Abnormal;  Notable for the following:    WBC 16.5 (*)    Neutrophils Relative % 85 (*)    Neutro Abs 14.0 (*)    Lymphocytes Relative 7 (*)    Monocytes Absolute 1.4 (*)    All other components within normal limits  COMPREHENSIVE METABOLIC PANEL - Abnormal; Notable for the following:    Sodium 136 (*)    Glucose, Bld 112 (*)    Total Bilirubin 1.8 (*)    All other components within normal limits  URINE MICROSCOPIC-ADD ON - Abnormal; Notable for the following:    Bacteria, UA MANY (*)    All other components within normal limits  TROPONIN I  D-DIMER, QUANTITATIVE    Imaging Review Dg Chest 2 View  02/01/2014   CLINICAL DATA:  Fever, nausea, headache  EXAM: CHEST  2 VIEW  COMPARISON:  12/18/2011  FINDINGS: Cardiomediastinal silhouette is stable. No acute infiltrate or pleural effusion. No pulmonary edema. Bony thorax is unremarkable.  IMPRESSION: No active cardiopulmonary disease.   Electronically Signed   By: Natasha MeadLiviu  Pop M.D.   On: 02/01/2014 16:08     EKG Interpretation   Date/Time:  Sunday February 01 2014 14:27:39 EDT Ventricular Rate:  102 PR Interval:  196 QRS Duration: 90 QT Interval:  320 QTC Calculation: 417 R Axis:   13 Text Interpretation:  Sinus tachycardia Cannot rule out Anterior infarct ,  age undetermined Abnormal ECG artifact in V2 Otherwise no significant  change since 2013 Confirmed by GOLDSTON  MD, SCOTT ([id number]) on 02/01/2014  2:31:26 PM      MDM   Final diagnoses:  UTI (lower urinary tract infection)  Headache  Chest pain    4:39 PM 63 y.o. male here with multiple complaints. He states he developed a gradual onset migraine consistent with previous migraines yesterday. He also notes some intermittent chest pressure which began last night after eating a large meal lasting minutes at a time and while at rest. RF's include HLP. Previously a smoker. Denies FH of cardiac dis. He felt like the pain was slightly pleuritic in nature. He is low risk per [place] with a negative  d-dimer here. He denies any chest pain or shortness of breath on exam  currently. He has a normal neurologic exam. He was found to have a low grade fever here. He notes that he has had some dysuria which began last night. He denies any abdominal pain, cp, or sob currently.   6:17 PM: Found to have UTI, elev wbc. Got rocephin IV here. HA significantly improved w/ HA cocktail. Do not think this is meningitis given HA c/w previous and easily treated. Fever likely related to UTI. Pt feeling much better, ambulatory, and well appearing. Low risk for MACE per HEART score and continues to deny cp on exam w/ cp starting greater than 10 hrs ago.   I have discussed the diagnosis/risks/treatment options with the patient and believe the pt to be eligible for discharge home to follow-up with pcp next week. We also discussed returning to the ED immediately if new or worsening sx occur. We discussed the sx which are most concerning (e.g., return of HA/cp/sob, intractable fever, inability to take abx) that necessitate immediate return. Medications administered to the patient during their visit and any new prescriptions provided to the patient are listed below.  Medications given during this visit Medications  cefTRIAXone (ROCEPHIN) 1 G injection (not administered)  sodium chloride 0.9 % bolus 1,000 mL (0 mLs Intravenous Stopped 02/01/14 1703)  metoCLOPramide (REGLAN) injection 10 mg (10 mg Intravenous Given 02/01/14 1601)  diphenhydrAMINE (BENADRYL) injection 25 mg (25 mg Intravenous Given 02/01/14 1600)  acetaminophen (TYLENOL) tablet 650 mg (650 mg Oral Given 02/01/14 1600)  cefTRIAXone (ROCEPHIN) 1 g in dextrose 5 % 50 mL IVPB (1 g Intravenous New Bag/Given 02/01/14 1702)    New Prescriptions   CEPHALEXIN (KEFLEX) 500 MG CAPSULE    Take 1 capsule (500 mg total) by mouth 4 (four) times daily.      I personally performed the services described in this documentation, which was scribed in my presence. The recorded  information has been reviewed and is accurate.    Junius Argyle, MD 02/02/14 [phone]

## 2014-02-01 NOTE — ED Notes (Signed)
Patient states that he is here "for multiple reasons". A headache for 2 days, history of migraines. Chest pain last night, and he thinks he passed a kidney stone. States that last night he had a sharp pain as he was urinating, but has never had  kidney stones.

## 2014-02-01 NOTE — ED Notes (Signed)
MD at bedside. 

## 2014-11-15 ENCOUNTER — Emergency Department (HOSPITAL_BASED_OUTPATIENT_CLINIC_OR_DEPARTMENT_OTHER)
Admission: EM | Admit: 2014-11-15 | Discharge: 2014-11-15 | Disposition: A | Discharge status: Home / Self Care | Payer: Managed Care, Other (non HMO) | Attending: Emergency Medicine | Admitting: Emergency Medicine

## 2014-11-15 ENCOUNTER — Encounter (HOSPITAL_BASED_OUTPATIENT_CLINIC_OR_DEPARTMENT_OTHER): Payer: Self-pay | Admitting: *Deleted

## 2014-11-15 ENCOUNTER — Emergency Department (HOSPITAL_BASED_OUTPATIENT_CLINIC_OR_DEPARTMENT_OTHER): Payer: Managed Care, Other (non HMO)

## 2014-11-15 DIAGNOSIS — Z792 Long term (current) use of antibiotics: Secondary | ICD-10-CM | POA: Insufficient documentation

## 2014-11-15 DIAGNOSIS — Z8639 Personal history of other endocrine, nutritional and metabolic disease: Secondary | ICD-10-CM | POA: Diagnosis not present

## 2014-11-15 DIAGNOSIS — G8929 Other chronic pain: Secondary | ICD-10-CM | POA: Diagnosis not present

## 2014-11-15 DIAGNOSIS — M1990 Unspecified osteoarthritis, unspecified site: Secondary | ICD-10-CM | POA: Insufficient documentation

## 2014-11-15 DIAGNOSIS — Z8701 Personal history of pneumonia (recurrent): Secondary | ICD-10-CM | POA: Insufficient documentation

## 2014-11-15 DIAGNOSIS — Z79899 Other long term (current) drug therapy: Secondary | ICD-10-CM | POA: Insufficient documentation

## 2014-11-15 DIAGNOSIS — R059 Cough, unspecified: Secondary | ICD-10-CM

## 2014-11-15 DIAGNOSIS — B349 Viral infection, unspecified: Secondary | ICD-10-CM | POA: Insufficient documentation

## 2014-11-15 DIAGNOSIS — R0602 Shortness of breath: Secondary | ICD-10-CM | POA: Diagnosis present

## 2014-11-15 DIAGNOSIS — Z8679 Personal history of other diseases of the circulatory system: Secondary | ICD-10-CM | POA: Insufficient documentation

## 2014-11-15 DIAGNOSIS — K219 Gastro-esophageal reflux disease without esophagitis: Secondary | ICD-10-CM | POA: Insufficient documentation

## 2014-11-15 DIAGNOSIS — R05 Cough: Secondary | ICD-10-CM

## 2014-11-15 DIAGNOSIS — Z872 Personal history of diseases of the skin and subcutaneous tissue: Secondary | ICD-10-CM | POA: Diagnosis not present

## 2014-11-15 LAB — URINALYSIS, ROUTINE W REFLEX MICROSCOPIC
BILIRUBIN URINE: NEGATIVE
GLUCOSE, UA: NEGATIVE mg/dL
HGB URINE DIPSTICK: NEGATIVE
Ketones, ur: NEGATIVE mg/dL
Leukocytes, UA: NEGATIVE
Nitrite: NEGATIVE
Protein, ur: NEGATIVE mg/dL
SPECIFIC GRAVITY, URINE: 1.021 (ref 1.005–1.030)
UROBILINOGEN UA: 1 mg/dL (ref 0.0–1.0)
pH: 8.5 — ABNORMAL HIGH (ref 5.0–8.0)

## 2014-11-15 LAB — CBC WITH DIFFERENTIAL/PLATELET
BASOS ABS: 0 10*3/uL (ref 0.0–0.1)
Basophils Relative: 0 % (ref 0–1)
EOS ABS: 0.2 10*3/uL (ref 0.0–0.7)
EOS PCT: 3 % (ref 0–5)
HCT: 47.6 % (ref 39.0–52.0)
HEMOGLOBIN: 16.2 g/dL (ref 13.0–17.0)
LYMPHS ABS: 1.2 10*3/uL (ref 0.7–4.0)
Lymphocytes Relative: 17 % (ref 12–46)
MCH: 29.6 pg (ref 26.0–34.0)
MCHC: 34 g/dL (ref 30.0–36.0)
MCV: 86.9 fL (ref 78.0–100.0)
MONOS PCT: 12 % (ref 3–12)
Monocytes Absolute: 0.8 10*3/uL (ref 0.1–1.0)
NEUTROS ABS: 4.7 10*3/uL (ref 1.7–7.7)
Neutrophils Relative %: 68 % (ref 43–77)
Platelets: 144 10*3/uL — ABNORMAL LOW (ref 150–400)
RBC: 5.48 MIL/uL (ref 4.22–5.81)
RDW: 13.7 % (ref 11.5–15.5)
WBC: 6.9 10*3/uL (ref 4.0–10.5)

## 2014-11-15 LAB — COMPREHENSIVE METABOLIC PANEL
ALK PHOS: 66 U/L (ref 39–117)
ALT: 31 U/L (ref 0–53)
ANION GAP: 9 (ref 5–15)
AST: 27 U/L (ref 0–37)
Albumin: 4 g/dL (ref 3.5–5.2)
BILIRUBIN TOTAL: 0.7 mg/dL (ref 0.3–1.2)
BUN: 15 mg/dL (ref 6–23)
CHLORIDE: 102 mmol/L (ref 96–112)
CO2: 28 mmol/L (ref 19–32)
Calcium: 9.1 mg/dL (ref 8.4–10.5)
Creatinine, Ser: 0.76 mg/dL (ref 0.50–1.35)
GFR calc Af Amer: >90 mL/min (ref >90)
GFR calc non Af Amer: >90 mL/min (ref >90)
Glucose, Bld: 108 mg/dL — ABNORMAL HIGH (ref 70–99)
Potassium: 4 mmol/L (ref 3.5–5.1)
Sodium: 139 mmol/L (ref 135–145)
Total Protein: 7.6 g/dL (ref 6.0–8.3)

## 2014-11-15 LAB — BRAIN NATRIURETIC PEPTIDE: B Natriuretic Peptide: 21.6 pg/mL (ref 0.0–100.0)

## 2014-11-15 LAB — TROPONIN I

## 2014-11-15 MED ORDER — ACETAMINOPHEN 325 MG PO TABS
650.0000 mg | ORAL_TABLET | Freq: Once | ORAL | Status: AC
  Administered 2014-11-15: 650 mg via ORAL
  Filled 2014-11-15: qty 2

## 2014-11-15 MED ORDER — HYDROCOD POLST-CHLORPHEN POLST 10-8 MG/5ML PO LQCR: 5.0000 mL | Freq: Every evening | ORAL | Status: DC | PRN

## 2014-11-15 NOTE — ED Notes (Signed)
Patient transported to X-ray via stretcher per tech. 

## 2014-11-15 NOTE — Discharge Instructions (Signed)
Cough, Adult  A cough is a reflex that helps clear your throat and airways. It can help heal the body or may be a reaction to an irritated airway. A cough may only last 2 or 3 weeks (acute) or may last more than 8 weeks (chronic).  CAUSES Acute cough:  Viral or bacterial infections. Chronic cough:  Infections.  Allergies.  Asthma.  Post-nasal drip.  Smoking.  Heartburn or acid reflux.  Some medicines.  Chronic lung problems (COPD).  Cancer. SYMPTOMS   Cough.  Fever.  Chest pain.  Increased breathing rate.  High-pitched whistling sound when breathing (wheezing).  Colored mucus that you cough up (sputum). TREATMENT   A bacterial cough may be treated with antibiotic medicine.  A viral cough must run its course and will not respond to antibiotics.  Your caregiver may recommend other treatments if you have a chronic cough. HOME CARE INSTRUCTIONS   Only take over-the-counter or prescription medicines for pain, discomfort, or fever as directed by your caregiver. Use cough suppressants only as directed by your caregiver.  Use a cold steam vaporizer or humidifier in your bedroom or home to help loosen secretions.  Sleep in a semi-upright position if your cough is worse at night.  Rest as needed.  Stop smoking if you smoke. SEEK IMMEDIATE MEDICAL CARE IF:   You have pus in your sputum.  Your cough starts to worsen.  You cannot control your cough with suppressants and are losing sleep.  You begin coughing up blood.  You have difficulty breathing.  You develop pain which is getting worse or is uncontrolled with medicine.  You have a fever. MAKE SURE YOU:   Understand these instructions.  Will watch your condition.  Will get help right away if you are not doing well or get worse. Document Released: 02/03/2011 Document Revised: 10/30/2011 Document Reviewed: 02/03/2011 ExitCare Patient Information 2015 [employer]. This information is not intended  to replace advice given to you by your health care provider. Make sure you discuss any questions you have with your health care provider.  

## 2014-11-15 NOTE — ED Provider Notes (Signed)
CSN: [id number]     Arrival date & time 11/15/14  1436 History   This chart was scribed for Purvis SheffieldForrest Shea Swalley, MD by Evon Slackerrance Branch, ED Scribe. This patient was seen in room MH05/MH05 and the patient's care was started at 3:16 PM.      Chief Complaint  Patient presents with  . Shortness of Breath   Patient is a 64 y.o. male presenting with shortness of breath. The history is provided by the patient. No language interpreter was used.  Shortness of Breath Severity:  Moderate Onset quality:  Gradual Duration:  3 months Timing:  Intermittent Progression:  Worsening Chronicity:  Recurrent Relieved by:  Nothing Worsened by:  Activity and exertion Associated symptoms: cough and diaphoresis   Associated symptoms: no abdominal pain, no chest pain, no fever, no headaches, no neck pain, no rash, no sore throat and no vomiting    HPI Comments: Tyrone Terry is a 64 y.o. male who presents to the Emergency Department complaining of worsening SOB onset December 2015. Pt states that he has associated productive cough, fatigue, postnasal drainage and chills. Pt reports some nausea, decreased appetite, diaphoresis, and intermittent dysuria. Pt reports chest wall soreness from coughing so much. Pt states that he has tried several medications with no relief. Pt states that he has tried Levaquin, prednisone and z-pack that haven't provided any relief. Pt states that when he was on Levaquin his cough was productive. Pt states that his SOB is worse with exertion and activity, no sob at rest. Pt states that he had similar symptoms 1 year prior and was diagnosed with a UTI. Pt denies chest pain or fevers.   Past Medical History  Diagnosis Date  . Hyperlipidemia     takes [place] Oil daily  . Pneumonia     hx of;last time about 3-2537yrs ago  . Sinus drainage     uses Flonase nightly as well as saline spray  . Sleep apnea     has not gotten report back from this  . Migraine     hx of;last time a couple of  yrs ago  . Arthritis     back   . Chronic back pain     degenerative disc disease  . Eczema   . GERD (gastroesophageal reflux disease)     takes Protonix daily  . Hemorrhoids   . Elevated PSA     sees urologist  . H/O urinary frequency    Past Surgical History  Procedure Laterality Date  . Knee arthroscopy  1995    left  . Colonosocpy    . Nasal septum surgery  12/22/2011  . Nasal septoplasty w/ turbinoplasty  12/22/2011    Procedure: NASAL SEPTOPLASTY WITH TURBINATE REDUCTION;  Surgeon: Osborn Cohoavid Shoemaker, MD;  [facility];  Service: ENT;  Laterality: Bilateral;   Family History  Problem Relation Age of Onset  . Anesthesia problems Neg Hx   . Hypotension Neg Hx   . Malignant hyperthermia Neg Hx   . Pseudochol deficiency Neg Hx    History  Substance Use Topics  . Smoking status: Former Games developermoker  . Smokeless tobacco: Never Used     Comment: quit 3876yrs ago  . Alcohol Use: Yes     Comment: monthly    Review of Systems  Constitutional: Positive for chills, diaphoresis and fatigue. Negative for fever.  HENT: Positive for postnasal drip. Negative for rhinorrhea and sore throat.   Eyes: Negative for visual disturbance.  Respiratory: Positive for cough and  shortness of breath.   Cardiovascular: Negative for chest pain.  Gastrointestinal: Positive for nausea. Negative for vomiting, abdominal pain and diarrhea.  Genitourinary: Positive for dysuria.  Musculoskeletal: Negative for back pain and neck pain.  Skin: Negative for rash.  Neurological: Negative for headaches.  Psychiatric/Behavioral: Negative for confusion.      Allergies  Statins  Home Medications   Prior to Admission medications   Medication Sig Start Date End Date Taking? Authorizing Provider  Pravastatin Sodium (PRAVACHOL PO) Take by mouth.   Yes Historical Provider, MD  Acetaminophen (TYLENOL PO) Take 1 tablet by mouth every 6 (six) hours as needed. For pain    Historical Provider, MD  B Complex-C  (B-COMPLEX WITH VITAMIN C) tablet Take 1 tablet by mouth daily.    Historical Provider, MD  cephALEXin (KEFLEX) 500 MG capsule Take 1 capsule (500 mg total) by mouth 4 (four) times daily. 02/01/14   Purvis Sheffield, MD  Cholecalciferol (VITAMIN D3) 3000 UNITS TABS Take 1 tablet by mouth daily.    Historical Provider, MD  gabapentin (NEURONTIN) 300 MG capsule Take 300 mg by mouth 3 (three) times daily.    Historical Provider, MD  meloxicam (MOBIC) 15 MG tablet Take 15 mg by mouth daily.    Historical Provider, MD  Omega-3 Krill Oil 500 MG CAPS Take 1 capsule by mouth daily.    Historical Provider, MD  pantoprazole (PROTONIX) 40 MG tablet Take 40 mg by mouth daily.    Historical Provider, MD  Red Yeast Rice 600 MG CAPS Take 1 capsule by mouth daily.    Historical Provider, MD  sodium chloride (OCEAN) 0.65 % SOLN nasal spray Place 1 spray into the nose every evening.    Historical Provider, MD   BP 179/81 mmHg  Pulse 101  Temp(Src) 99.1 F (37.3 C) (Oral)  Resp 20  Ht  (1.676 m)  Wt 285 lb (129.275 kg)  BMI 46.02 kg/m2  SpO2 96%   Physical Exam  Constitutional: He is oriented to person, place, and time. He appears well-developed and well-nourished. No distress.  obese  HENT:  Head: Normocephalic and atraumatic.  Right Ear: Hearing normal.  Left Ear: Hearing normal.  Nose: Nose normal.  Mouth/Throat: Oropharynx is clear and moist and mucous membranes are normal.  Eyes: Conjunctivae and EOM are normal. Pupils are equal, round, and reactive to light.  Neck: Normal range of motion. Neck supple.  Cardiovascular: Regular rhythm, S1 normal and S2 normal.  Exam reveals no gallop and no friction rub.   No murmur heard. Pulmonary/Chest: Effort normal and breath sounds normal. No respiratory distress. He exhibits no tenderness.  Abdominal: Soft. Normal appearance and bowel sounds are normal. There is no hepatosplenomegaly. There is no tenderness. There is no rebound, no guarding, no  tenderness at McBurney's point and negative Murphy's sign. No hernia.  Musculoskeletal: Normal range of motion. He exhibits edema.  Trace edema in distal lower extremities.   Neurological: He is alert and oriented to person, place, and time. He has normal strength. No cranial nerve deficit or sensory deficit. Coordination normal. GCS eye subscore is 4. GCS verbal subscore is 5. GCS motor subscore is 6.  Skin: Skin is warm, dry and intact. No rash noted. No cyanosis.  Psychiatric: He has a normal mood and affect. His speech is normal and behavior is normal. Thought content normal.  Nursing note and vitals reviewed.   ED Course  Procedures (including critical care time) DIAGNOSTIC STUDIES: Oxygen Saturation is 96% on  RA, adequate by my interpretation.    COORDINATION OF CARE: 3:44 PM-Discussed treatment plan with pt at bedside and pt agreed to plan.     Labs Review Labs Reviewed  CBC WITH DIFFERENTIAL/PLATELET - Abnormal; Notable for the following:    Platelets 144 (*)    All other components within normal limits  COMPREHENSIVE METABOLIC PANEL - Abnormal; Notable for the following:    Glucose, Bld 108 (*)    All other components within normal limits  URINALYSIS, ROUTINE W REFLEX MICROSCOPIC - Abnormal; Notable for the following:    pH 8.5 (*)    All other components within normal limits  TROPONIN I  BRAIN NATRIURETIC PEPTIDE    Imaging Review Dg Chest 2 View  11/15/2014   CLINICAL DATA:  Productive cough, shortness of Breath  EXAM: CHEST  2 VIEW  COMPARISON:  10/30/2014  FINDINGS: Cardiomediastinal silhouette is stable. No acute infiltrate or pleural effusion. No pulmonary edema. Bony thorax is unremarkable.  IMPRESSION: No active cardiopulmonary disease.   Electronically Signed   By: Natasha Mead M.D.   On: 11/15/2014 15:07     EKG Interpretation None      MDM   Final diagnoses:  Viral syndrome  Cough   4:01 PM 64 y.o. male w hx of HLD who presents with chronic cough  since December 2015. He states that he continues to have productive cough, sweats, chills, postnasal drainage despite multiple rounds of antibiotics and a round of steroids. He denies shortness of breath at rest and states it is mostly with exertion. He has chest wall pain with coughing. He has a low-grade rectal temperature here and is mildly hypertensive, his vital signs are otherwise unremarkable. Likely ongoing viral syndrome or URI. Will get screening labs and imaging.   I interpreted/reviewed the labs and/or imaging which were non-contributory.  Pt continues to appear well.  I have discussed the diagnosis/risks/treatment options with the patient and believe the pt to be eligible for discharge home to follow-up with his pcp. We also discussed returning to the ED immediately if new or worsening sx occur. We discussed the sx which are most concerning (e.g., worsening sob, cp, intractable fever) that necessitate immediate return. Medications administered to the patient during their visit and any new prescriptions provided to the patient are listed below.  Medications given during this visit Medications  acetaminophen (TYLENOL) tablet 650 mg (650 mg Oral Given 11/15/14 1620)    Discharge Medication List as of 11/15/2014  6:39 PM    START taking these medications   Details  chlorpheniramine-HYDROcodone (TUSSIONEX PENNKINETIC ER) 10-8 MG/5ML LQCR Take 5 mLs by mouth at bedtime as needed for cough., Starting 11/15/2014, Until Discontinued, Print           I personally performed the services described in this documentation, which was scribed in my presence. The recorded information has been reviewed and is accurate.      Purvis Sheffield, MD 11/17/14 2108

## 2014-11-15 NOTE — ED Notes (Signed)
Pt reports SOB and cough since December- Has been evaluated by PCP and had antibiotics and steroids- chest xray 3 weeks ago- symptoms are getting worse per pt

## 2014-11-15 NOTE — ED Notes (Signed)
Pt diaphoretic upon RN entering room, reported cough, congestion and sinus drainage since 07/2014 intermittently, being tx with antibiotics several times and steroids x 1.  Reports cough and sob worsening, appears dyspneic with minimal exertion.  Pruritic rash noted on body diffusely with large reddened spot on right buttock.  Denies cp.

## 2014-11-15 NOTE — ED Notes (Signed)
Pt offered urinal for urine collection.

## 2016-03-27 ENCOUNTER — Emergency Department (HOSPITAL_BASED_OUTPATIENT_CLINIC_OR_DEPARTMENT_OTHER)
Admission: EM | Admit: 2016-03-27 | Discharge: 2016-03-28 | Disposition: A | Discharge status: Home / Self Care | Payer: Managed Care, Other (non HMO) | Attending: Emergency Medicine | Admitting: Emergency Medicine

## 2016-03-27 ENCOUNTER — Encounter (HOSPITAL_BASED_OUTPATIENT_CLINIC_OR_DEPARTMENT_OTHER): Payer: Self-pay

## 2016-03-27 DIAGNOSIS — R0982 Postnasal drip: Secondary | ICD-10-CM | POA: Insufficient documentation

## 2016-03-27 DIAGNOSIS — M791 Myalgia: Secondary | ICD-10-CM | POA: Diagnosis not present

## 2016-03-27 DIAGNOSIS — R509 Fever, unspecified: Secondary | ICD-10-CM | POA: Insufficient documentation

## 2016-03-27 DIAGNOSIS — R51 Headache: Secondary | ICD-10-CM | POA: Diagnosis present

## 2016-03-27 DIAGNOSIS — Z87891 Personal history of nicotine dependence: Secondary | ICD-10-CM | POA: Insufficient documentation

## 2016-03-27 DIAGNOSIS — M542 Cervicalgia: Secondary | ICD-10-CM | POA: Diagnosis not present

## 2016-03-27 DIAGNOSIS — R110 Nausea: Secondary | ICD-10-CM | POA: Diagnosis not present

## 2016-03-27 MED ORDER — ACETAMINOPHEN 325 MG PO TABS
650.0000 mg | ORAL_TABLET | Freq: Once | ORAL | Status: AC
  Administered 2016-03-27: 650 mg via ORAL
  Filled 2016-03-27: qty 2

## 2016-03-27 NOTE — ED Triage Notes (Signed)
C/o HA, nausea, photophobia, neck pain x 5 days-fever x today-pt able to touch chin to chest and is turning head left to right w/o difficulty-NAD-presents to triage in w/c

## 2016-03-28 ENCOUNTER — Encounter (HOSPITAL_BASED_OUTPATIENT_CLINIC_OR_DEPARTMENT_OTHER): Payer: Self-pay | Admitting: Emergency Medicine

## 2016-03-28 ENCOUNTER — Emergency Department (HOSPITAL_BASED_OUTPATIENT_CLINIC_OR_DEPARTMENT_OTHER): Payer: Managed Care, Other (non HMO)

## 2016-03-28 LAB — URINALYSIS, ROUTINE W REFLEX MICROSCOPIC
BILIRUBIN URINE: NEGATIVE
GLUCOSE, UA: NEGATIVE mg/dL
HGB URINE DIPSTICK: NEGATIVE
KETONES UR: NEGATIVE mg/dL
Leukocytes, UA: NEGATIVE
NITRITE: NEGATIVE
PH: 6.5 (ref 5.0–8.0)
Protein, ur: NEGATIVE mg/dL
SPECIFIC GRAVITY, URINE: 1.014 (ref 1.005–1.030)

## 2016-03-28 LAB — RAPID STREP SCREEN (MED CTR MEBANE ONLY): Streptococcus, Group A Screen (Direct): NEGATIVE

## 2016-03-28 LAB — BASIC METABOLIC PANEL
Anion gap: 9 (ref 5–15)
BUN: 9 mg/dL (ref 6–20)
CALCIUM: 9 mg/dL (ref 8.9–10.3)
CHLORIDE: 99 mmol/L — AB (ref 101–111)
CO2: 28 mmol/L (ref 22–32)
CREATININE: 0.83 mg/dL (ref 0.61–1.24)
GFR calc non Af Amer: >60 mL/min (ref >60)
GLUCOSE: 101 mg/dL — AB (ref 65–99)
Potassium: 3.5 mmol/L (ref 3.5–5.1)
Sodium: 136 mmol/L (ref 135–145)

## 2016-03-28 LAB — CBC WITH DIFFERENTIAL/PLATELET
BASOS PCT: 1 %
Basophils Absolute: 0 10*3/uL (ref 0.0–0.1)
Eosinophils Absolute: 0 10*3/uL (ref 0.0–0.7)
Eosinophils Relative: 1 %
HEMATOCRIT: 44 % (ref 39.0–52.0)
HEMOGLOBIN: 15.4 g/dL (ref 13.0–17.0)
LYMPHS ABS: 1.4 10*3/uL (ref 0.7–4.0)
Lymphocytes Relative: 30 %
MCH: 30 pg (ref 26.0–34.0)
MCHC: 35 g/dL (ref 30.0–36.0)
MCV: 85.8 fL (ref 78.0–100.0)
MONO ABS: 0.7 10*3/uL (ref 0.1–1.0)
MONOS PCT: 15 %
NEUTROS ABS: 2.4 10*3/uL (ref 1.7–7.7)
NEUTROS PCT: 53 %
Platelets: 140 10*3/uL — ABNORMAL LOW (ref 150–400)
RBC: 5.13 MIL/uL (ref 4.22–5.81)
RDW: 13.6 % (ref 11.5–15.5)
WBC: 4.5 10*3/uL (ref 4.0–10.5)

## 2016-03-28 MED ORDER — SODIUM CHLORIDE 0.9 % IV BOLUS (SEPSIS)
1000.0000 mL | Freq: Once | INTRAVENOUS | Status: AC
  Administered 2016-03-28: 1000 mL via INTRAVENOUS

## 2016-03-28 MED ORDER — KETOROLAC TROMETHAMINE 30 MG/ML IJ SOLN
30.0000 mg | Freq: Once | INTRAMUSCULAR | Status: AC
  Administered 2016-03-28: 30 mg via INTRAVENOUS
  Filled 2016-03-28: qty 1

## 2016-03-28 MED ORDER — DOXYCYCLINE HYCLATE 100 MG PO CAPS: 100.0000 mg | ORAL_CAPSULE | Freq: Two times a day (BID) | ORAL | 0 refills | Status: DC

## 2016-03-28 MED ORDER — KETOROLAC TROMETHAMINE 60 MG/2ML IM SOLN
60.0000 mg | Freq: Once | INTRAMUSCULAR | Status: DC
Start: 2016-03-28 — End: 2016-03-28

## 2016-03-28 MED ORDER — DOXYCYCLINE HYCLATE 100 MG PO TABS
100.0000 mg | ORAL_TABLET | Freq: Once | ORAL | Status: AC
  Administered 2016-03-28: 100 mg via ORAL
  Filled 2016-03-28: qty 1

## 2016-03-28 NOTE — ED Provider Notes (Addendum)
[facility] Note   CSN: [id number] Arrival date & time: 03/27/16  2215  First Provider Contact:   First MD Initiated Contact with Patient 03/28/16 0019     By signing my name below, I, Soijett Blue, attest that this documentation has been prepared under the direction and in the presence of Darina Hartwell, MD. Electronically Signed: Soijett Blue, ED Scribe. 03/28/16. 12:29 AM.    History   Chief Complaint Chief Complaint  Patient presents with  . Headache    HPI  Tyrone Terry is a 65 y.o. male with a medical hx of migraine who presents to the Emergency Department complaining of HA onset 5 days. Pt notes that his HA is localized to the occipital and temporal area. He states that he is having associated symptoms of chills, urinary frequency, fever of 101 today, photophobia, nausea, neck pain, and post nasal drip. He states that he has tried Amoxil, tylenol, with no relief for his symptoms. He denies cough, congestion, rhinorrhea, sore throat, ear pain, dysuria, rash, vomiting, and any other symptoms. Denies tick bites or being camping recently. Denies allergies to any medications.  Has had a fever for 4 days.  No rashes. No travel.  Has not found a tick on himself recently   The history is provided by the patient and the spouse. No language interpreter was used.  Headache   This is a new problem. The current episode started more than 2 days ago (5 days). The problem occurs constantly. The problem has not changed since onset.The headache is associated with bright light. The pain is located in the temporal region. The quality of the pain is described as dull. The pain is mild. The pain does not radiate. Associated symptoms include a fever and nausea. Pertinent negatives include no anorexia, no palpitations, no shortness of breath and no vomiting. He has tried acetaminophen and NSAIDs (amoxil) for the symptoms. The treatment provided no relief.    Past Medical  History:  Diagnosis Date  . Arthritis    back   . Chronic back pain    degenerative disc disease  . Eczema   . Elevated PSA    sees urologist  . GERD (gastroesophageal reflux disease)    takes Protonix daily  . H/O urinary frequency   . Hemorrhoids   . Hyperlipidemia    takes [place] daily  . Migraine    hx of;last time a couple of yrs ago  . Pneumonia    hx of;last time about 3-74yrs ago  . Sinus drainage    uses Flonase nightly as well as saline spray  . Sleep apnea    has not gotten report back from this    Patient Active Problem List   Diagnosis Date Noted  . 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 BACK SURGERY    . colonosocpy    . KNEE ARTHROSCOPY  1995   left  . NASAL SEPTOPLASTY W/ TURBINOPLASTY  12/22/2011   Procedure: NASAL SEPTOPLASTY WITH TURBINATE REDUCTION;  Surgeon: Osborn Coho, MD;  Location: [facility] OR;  Service: ENT;  Laterality: Bilateral;  . NASAL SEPTUM SURGERY  12/22/2011       Home Medications    Prior to Admission medications   Medication Sig Start Date End Date Taking? Authorizing Provider  Acetaminophen (TYLENOL PO) Take 1 tablet by mouth every 6 (six) hours as needed. For pain  Historical Provider, MD  B Complex-C (B-COMPLEX WITH VITAMIN C) tablet Take 1 tablet by mouth daily.    Historical Provider, MD  Cholecalciferol (VITAMIN D3) 3000 UNITS TABS Take 1 tablet by mouth daily.    Historical Provider, MD  pantoprazole (PROTONIX) 40 MG tablet Take 40 mg by mouth daily.    Historical Provider, MD  Pravastatin Sodium (PRAVACHOL PO) Take by mouth.    Historical Provider, MD    Family History Family History  Problem Relation Age of Onset  . Anesthesia problems Neg Hx   . Hypotension Neg Hx   . Malignant hyperthermia Neg Hx   . Pseudochol deficiency Neg Hx     Social History Social History  Substance Use Topics  . Smoking status: Former Games developermoker  . Smokeless  tobacco: Never Used     Comment: quit 8344yrs ago  . Alcohol use Yes     Comment: occ     Allergies   Statins   Review of Systems Review of Systems  Constitutional: Positive for chills and fever. Negative for appetite change and diaphoresis.  HENT: Positive for postnasal drip. Negative for congestion, dental problem, ear pain, sneezing, sore throat and voice change.   Eyes: Negative for visual disturbance.  Respiratory: Negative for cough, chest tightness and shortness of breath.   Cardiovascular: Negative for chest pain and palpitations.  Gastrointestinal: Positive for nausea. Negative for abdominal pain, anorexia and vomiting.  Genitourinary: Negative for dysuria and flank pain.  Musculoskeletal: Positive for myalgias and neck pain. Negative for neck stiffness.  Skin: Negative for color change, pallor, rash and wound.  Neurological: Positive for headaches. Negative for dizziness and facial asymmetry.  Hematological: Negative for adenopathy.  All other systems reviewed and are negative.    Physical Exam Updated Vital Signs BP 120/78 (BP Location: Right Arm)   Pulse 104   Temp 101.6 F (38.7 C) (Oral) Comment: RN Clydie BraunKaren informed of Temp  Resp 20   Ht 5\' 6"  (1.676 m)   Wt 258 lb (117 kg)   SpO2 96%   BMI 41.64 kg/m   Physical Exam  Constitutional: He is oriented to person, place, and time. He appears well-developed and well-nourished. No distress.  HENT:  Head: Normocephalic and atraumatic.  Mouth/Throat: Oropharynx is clear and moist. No oropharyngeal exudate.  Eyes: EOM are normal. Pupils are equal, round, and reactive to light.  Neck: Normal range of motion and full passive range of motion without pain. Neck supple. No JVD present. No spinous process tenderness and no muscular tenderness present. Carotid bruit is not present. No neck rigidity. No tracheal deviation and normal range of motion present. No Brudzinski's sign and no Kernig's sign noted.  Cardiovascular:  Normal rate, regular rhythm and normal heart sounds.  Exam reveals no gallop and no friction rub.   No murmur heard. Pulmonary/Chest: Effort normal and breath sounds normal. No stridor. No respiratory distress. He has no wheezes. He has no rales.  Abdominal: Soft. Bowel sounds are normal. He exhibits no distension and no mass. There is no tenderness. There is no rebound and no guarding.  Musculoskeletal: Normal range of motion.       Cervical back: Normal.       Thoracic back: Normal.       Lumbar back: Normal.  No crepitus, point tenderness, or step-offs of C, T, or L spine.  Lymphadenopathy:       Head (right side): No submandibular, no preauricular, no posterior auricular and no occipital adenopathy present.  Head (left side): No submandibular, no preauricular, no posterior auricular and no occipital adenopathy present.    He has no cervical adenopathy.       Right: No supraclavicular adenopathy present.       Left: No supraclavicular adenopathy present.  Neurological: He is alert and oriented to person, place, and time. He has normal reflexes.  Skin: Skin is warm and dry.  Psychiatric: He has a normal mood and affect. His behavior is normal.  Nursing note and vitals reviewed.   ED Treatments / Results  DIAGNOSTIC STUDIES: Oxygen Saturation is 96% on RA, nl by my interpretation.    COORDINATION OF CARE: 12:29 AM Discussed treatment plan with pt at bedside which includes tylenol and pt agreed to plan.   Labs (all labs ordered are listed, but only abnormal results are displayed) Labs Reviewed - No data to display  EKG  EKG Interpretation None       Radiology No results found.  Procedures Procedures (including critical care time)  Medications Ordered in ED Medications  acetaminophen (TYLENOL) tablet 650 mg (650 mg Oral Given 03/27/16 2231)     Initial Impression / Assessment and Plan / ED Course  I have reviewed the triage vital signs and the nursing  notes.  Pertinent labs & imaging results that were available during my care of the patient were reviewed by me and considered in my medical decision making (see chart for details).  Clinical Course    Vitals:   03/28/16 0430 03/28/16 0445  BP: 104/74 97/60  Pulse: 83 76  Resp:    Temp:     Results for orders placed or performed during the hospital encounter of 03/27/16  Rapid strep screen  Result Value Ref Range   Streptococcus, Group A Screen (Direct) NEGATIVE NEGATIVE  CBC with Differential/Platelet  Result Value Ref Range   WBC 4.5 4.0 - 10.5 K/uL   RBC 5.13 4.22 - 5.81 MIL/uL   Hemoglobin 15.4 13.0 - 17.0 g/dL   HCT 16.1 09.6 - 04.5 %   MCV 85.8 78.0 - 100.0 fL   MCH 30.0 26.0 - 34.0 pg   MCHC 35.0 30.0 - 36.0 g/dL   RDW 40.9 81.1 - 91.4 %   Platelets 140 (L) 150 - 400 K/uL   Neutrophils Relative % 53 %   Neutro Abs 2.4 1.7 - 7.7 K/uL   Lymphocytes Relative 30 %   Lymphs Abs 1.4 0.7 - 4.0 K/uL   Monocytes Relative 15 %   Monocytes Absolute 0.7 0.1 - 1.0 K/uL   Eosinophils Relative 1 %   Eosinophils Absolute 0.0 0.0 - 0.7 K/uL   Basophils Relative 1 %   Basophils Absolute 0.0 0.0 - 0.1 K/uL  Basic metabolic panel  Result Value Ref Range   Sodium 136 135 - 145 mmol/L   Potassium 3.5 3.5 - 5.1 mmol/L   Chloride 99 (L) 101 - 111 mmol/L   CO2 28 22 - 32 mmol/L   Glucose, Bld 101 (H) 65 - 99 mg/dL   BUN 9 6 - 20 mg/dL   Creatinine, Ser 7.82 0.61 - 1.24 mg/dL   Calcium 9.0 8.9 - 95.6 mg/dL   GFR calc non Af Amer >60 >60 mL/min   GFR calc Af Amer >60 >60 mL/min   Anion gap 9 5 - 15  Urinalysis, Routine w reflex microscopic (not at [facility])  Result Value Ref Range   Color, Urine YELLOW YELLOW   APPearance CLEAR CLEAR   Specific Gravity, Urine  1.014 1.005 - 1.030   pH 6.5 5.0 - 8.0   Glucose, UA NEGATIVE NEGATIVE mg/dL   Hgb urine dipstick NEGATIVE NEGATIVE   Bilirubin Urine NEGATIVE NEGATIVE   Ketones, ur NEGATIVE NEGATIVE mg/dL   Protein, ur NEGATIVE NEGATIVE  mg/dL   Nitrite NEGATIVE NEGATIVE   Leukocytes, UA NEGATIVE NEGATIVE   Dg Chest 2 View  Result Date: 03/28/2016 CLINICAL DATA:  Headache, dizziness, nausea and neck tightness for 4 days, fever for 2 days. History of pneumonia. EXAM: CHEST  2 VIEW COMPARISON:  Chest radiograph November 15, 2014 FINDINGS: Cardiomediastinal silhouette is normal. Bandlike density LEFT lung base stable from prior imaging. No pleural effusion or focal consolidation. Mild pleural fat. No pneumothorax. Soft tissue planes and included osseous structure are nonsuspicious. IMPRESSION: LEFT lung base atelectasis or scarring. Electronically Signed   By: Awilda Metro M.D.   On: 03/28/2016 01:51   Medications  acetaminophen (TYLENOL) tablet 650 mg (650 mg Oral Given 03/27/16 2231)  sodium chloride 0.9 % bolus 1,000 mL (0 mLs Intravenous Stopped 03/28/16 0302)  ketorolac (TORADOL) 30 MG/ML injection 30 mg (30 mg Intravenous Given 03/28/16 0209)  doxycycline (VIBRA-TABS) tablet 100 mg (100 mg Oral Given 03/28/16 0500)     Final Clinical Impressions(s) / ED Diagnoses   Final diagnoses:  None    New Prescriptions New Prescriptions   No medications on file  I highly doubt meningitis because given the fever  Time course neck would be stiff and patient would be toxic appearing and patient would be in extremis.  He is well appearing pr and post treatment.  I suspect this is viral but given tick borne illness pattern in this area will start doxycycline and have patient follow up with PMD today for tick borne illness testing.  Patient is feeling markedly improved post medication.   Patient and family are comfortable with this plan.  All questions answered to patient's satisfaction. Based on history and exam patient has been appropriately medically screened and emergency conditions excluded. Patient is stable for discharge at this time. Follow up with your PMDfor recheck in 2 daysand strict return precautions given. I personally  performed the services described in this documentation, which was scribed in my presence. The recorded information has been reviewed and is accurate.      Cy Blamer, MD 03/28/16 1610    Cy Blamer, MD 03/28/16 [phone]

## 2016-03-28 NOTE — ED Notes (Signed)
Pt to radiology, no changes, alert, NAD, calm, interactive, no dyspnea noted.

## 2016-03-28 NOTE — ED Notes (Signed)
Alert, NAD<m calm, "feeling better", attempting urine sample at this time.

## 2016-03-28 NOTE — ED Notes (Signed)
Pt seen by EDP prior to RN assessment, see MD notes, pending orders, pt denies questions or needs, "feel a little better since arrival and tylenol, rates HA 2/10, also reports fever, shills and sweats, "has been working outside in heat", recent return to work after back surgery in April, reports maybe not drinking as much as I should" (denies: nvd, dizziness, sob, cough, urinary sx, or back pain, other pain, or other sx). Alert, NAD, calm, interactive, speech clear.

## 2016-03-30 LAB — CULTURE, GROUP A STREP (THRC)

## 2017-05-18 IMAGING — DX DG CHEST 2V
2 series · 2 of 2 positions shown · non-contrast
Comparison: Chest radiograph November 15, 2014

CLINICAL DATA: Headache, dizziness, nausea and neck tightness for 4
days, fever for 2 days. History of pneumonia.

EXAM:
CHEST  2 VIEW

[chest pa]
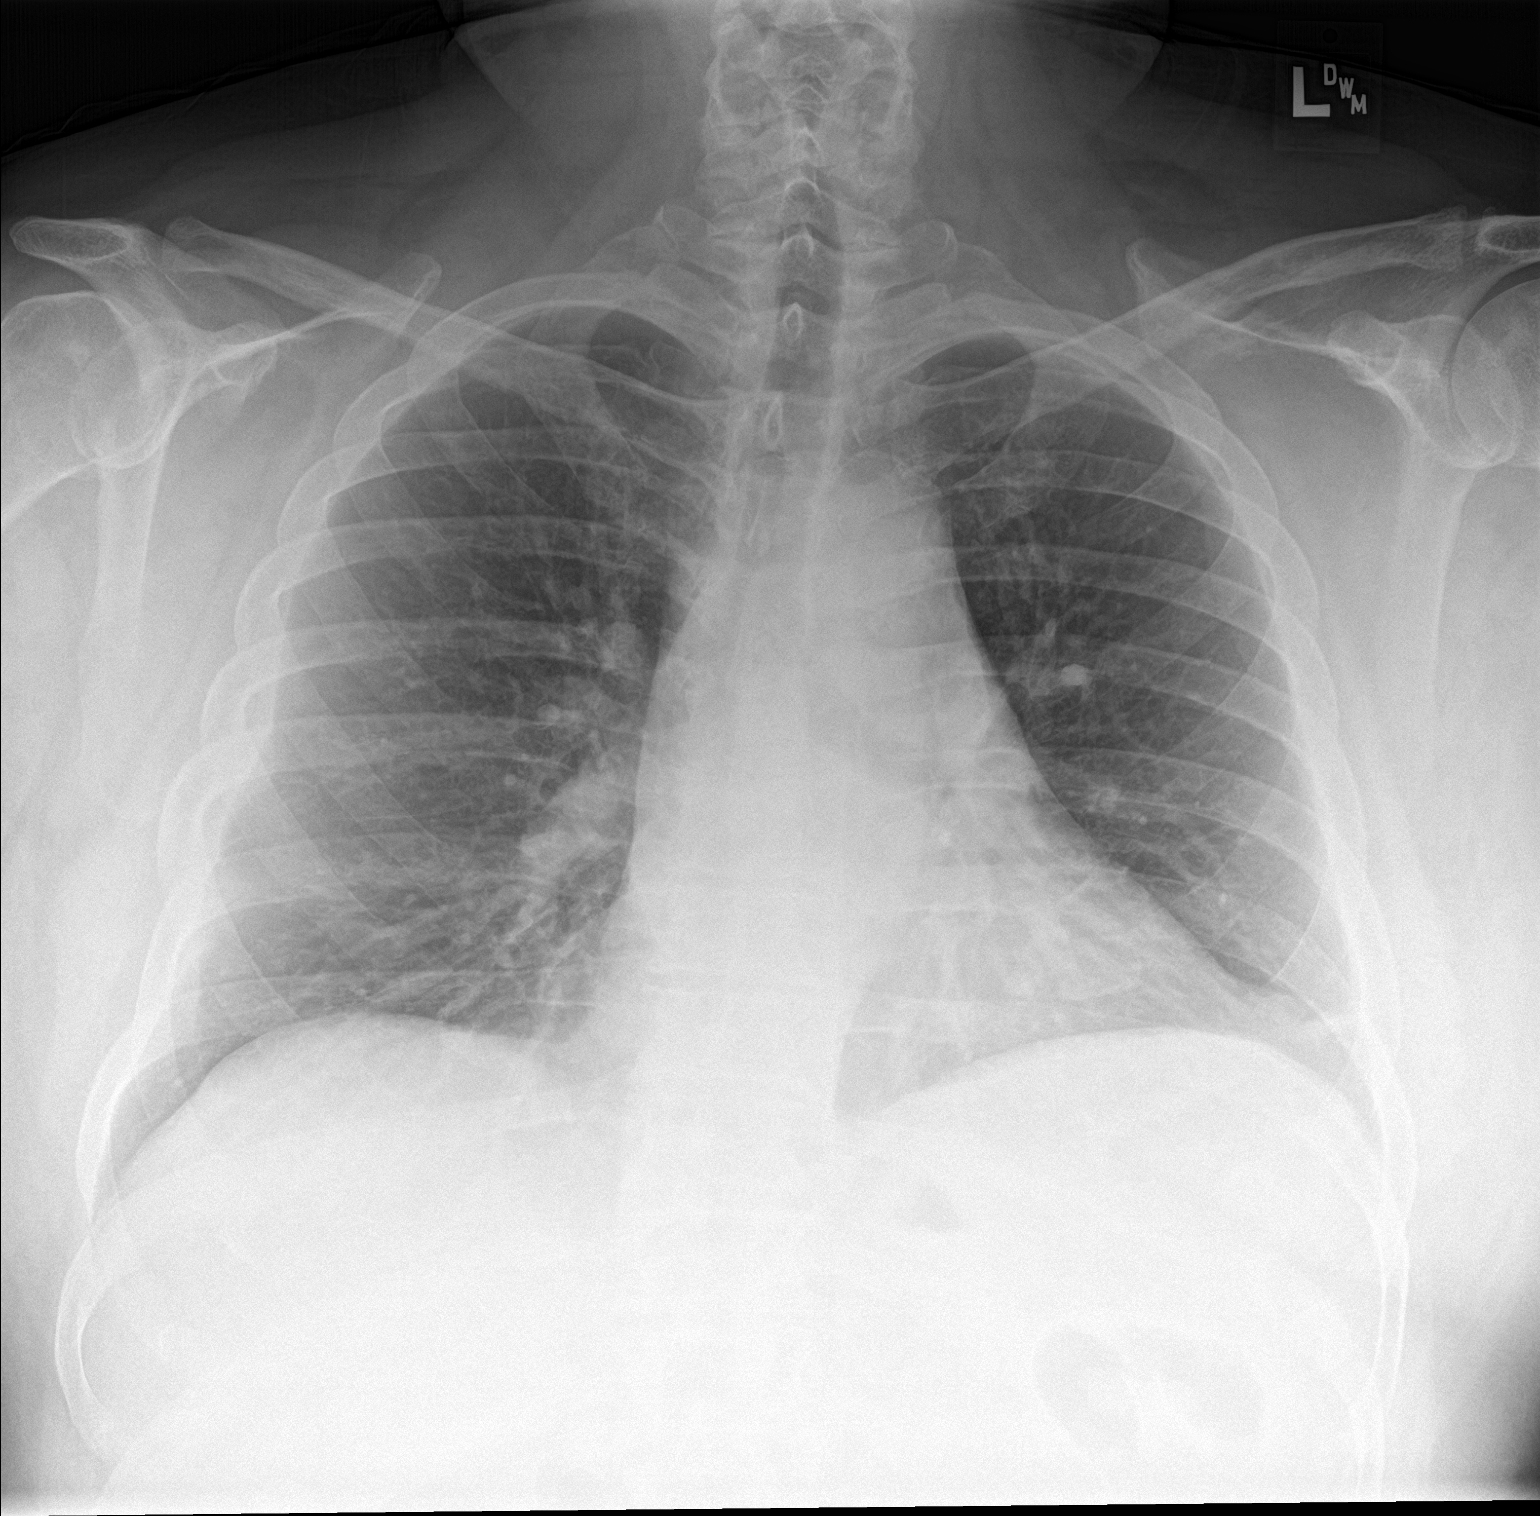

[chest lat]
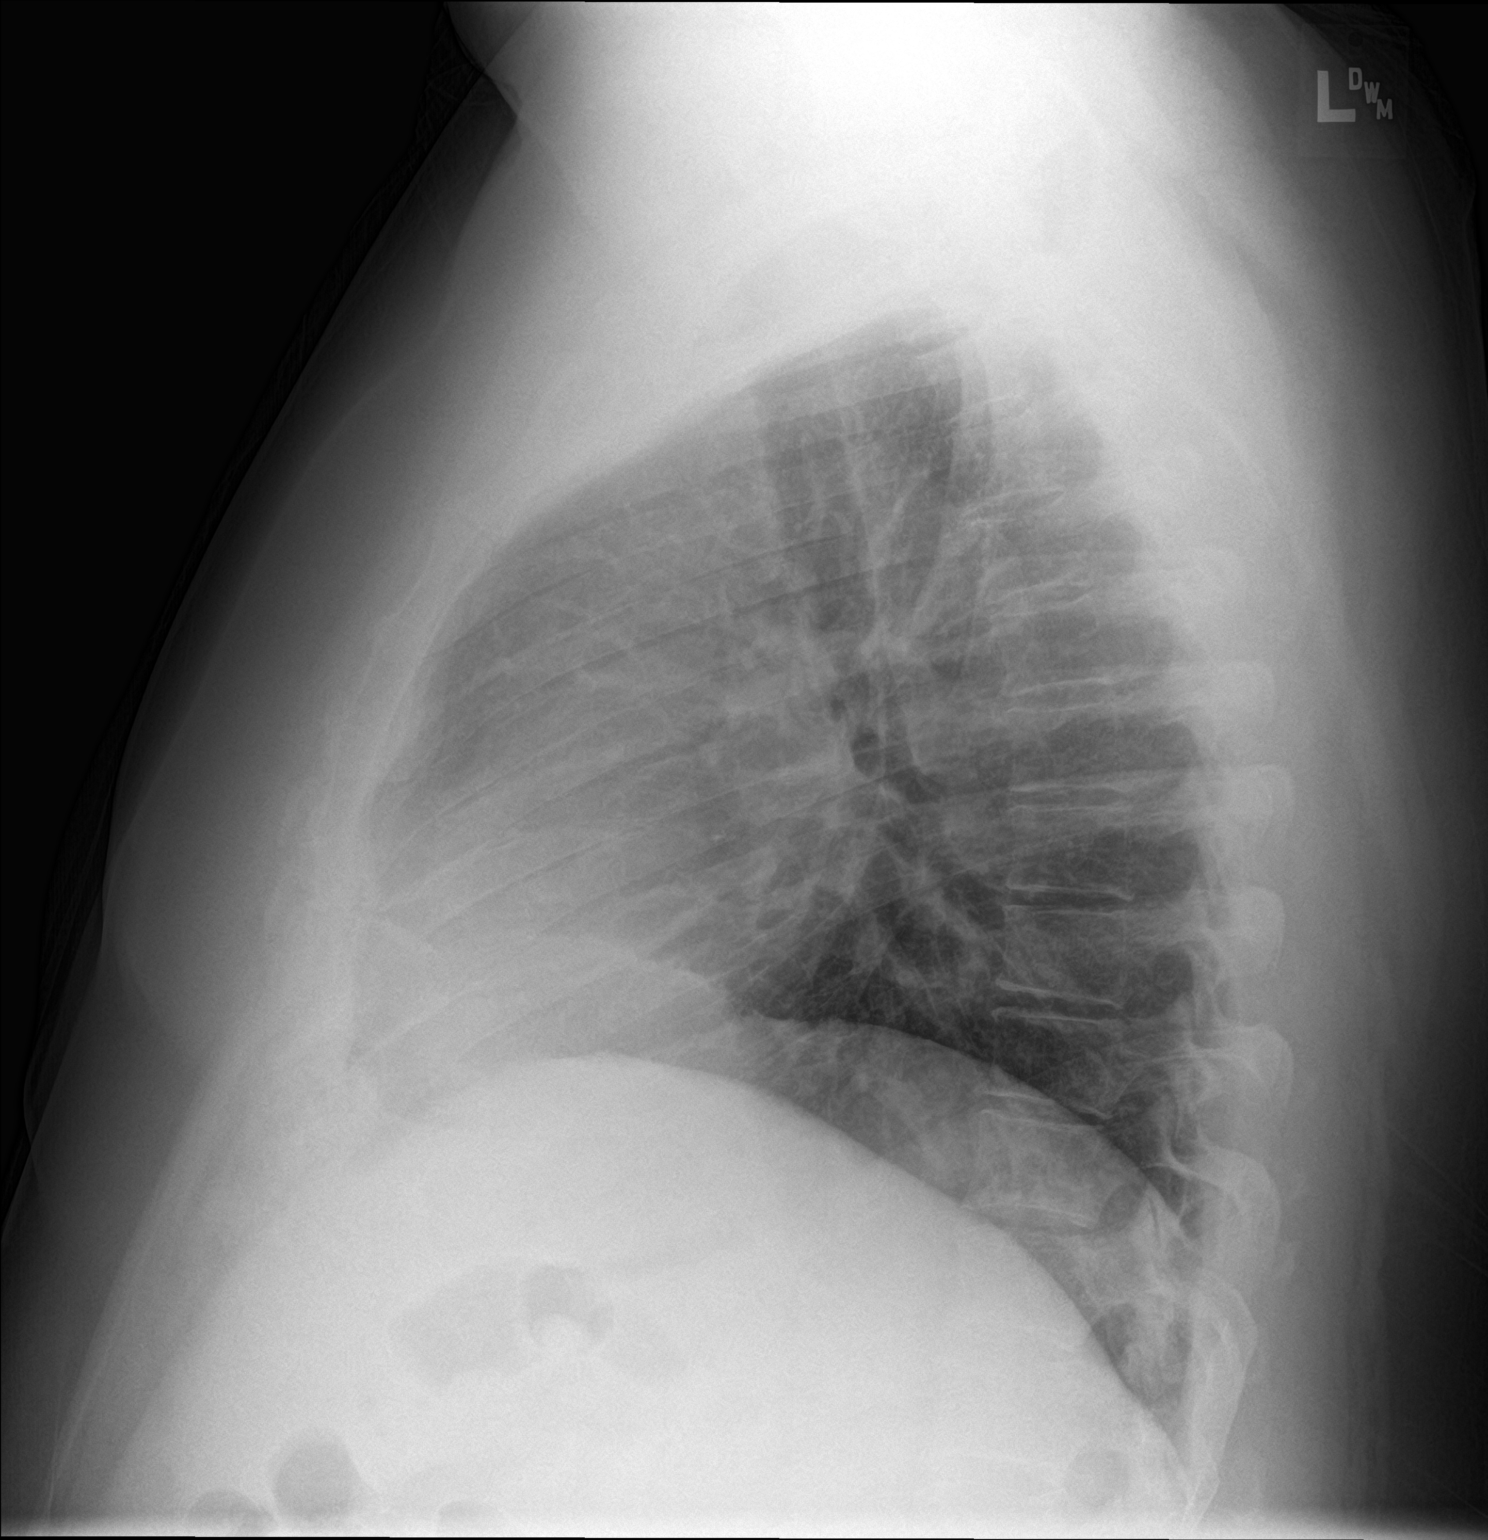

[2 of 2 positions shown; findings below may reference images not displayed]

FINDINGS: Cardiomediastinal silhouette is normal. Bandlike density LEFT lung
base stable from prior imaging. No pleural effusion or focal
consolidation. Mild pleural fat. No pneumothorax. Soft tissue planes
and included osseous structure are nonsuspicious.
IMPRESSION: LEFT lung base atelectasis or scarring.

## 2022-02-24 ENCOUNTER — Encounter: Payer: Self-pay | Admitting: *Deleted

## 2022-02-28 ENCOUNTER — Ambulatory Visit (INDEPENDENT_AMBULATORY_CARE_PROVIDER_SITE_OTHER): Payer: Medicare Other | Admitting: Neurology

## 2022-02-28 ENCOUNTER — Encounter: Payer: Self-pay | Admitting: Neurology

## 2022-02-28 VITALS — BP 139/79 | HR 62 | Ht 66.0 in | Wt 253.4 lb

## 2022-02-28 DIAGNOSIS — G250 Essential tremor: Secondary | ICD-10-CM

## 2022-02-28 NOTE — Progress Notes (Signed)
Subjective:    Patient ID: Tyrone Terry is a 71 y.o. male.  HPI    Saima Athar, MD, PhD [facility] [address], [address] [address] [place], [place] [address]  Dear Lori,   I saw your patient, Tyrone Terry, upon your kind request in my clinic today for initial consultation of his tremor.  The patient is accompanied by his wife today.  As you know, Mr. Tyrone Terry is a 71-year-old right-handed gentleman with an underlying medical history of arthritis, chronic back pain, eczema, reflux disease, hypertension, hyperlipidemia, history of MI, sleep apnea, and severe obesity with a BMI of over 40, who reports an approximately 3-year history of tremors affecting primarily his left more than right hands.  His wife has noticed an intermittent head shaking but he is not aware of it.  His tremor has become worse, he feels that it became worse after he had COVID, he had COVID twice, the second time he was treated with Paxlovid but he was never hospitalized.  He does not have a family history of tremors but his dad had Parkinson's disease, diagnosed in his 80s, he lived to be 92 years old, mom died young at age 44 from melanoma.  Patient has 1 sister alive, his other sister passed away from cancer.  He is retired, he used to work as a welder.  He lives with his wife, he quit smoking some 36 years ago and drinks caffeine in the form of coffee, at least 2 cups daily, some soda which she is not caffeinated.  He tries to hydrate well with water, he likes to work outside.  He plays the guitar and in the tremor does not seem to affect this but he does have a tremor when he holds something or feeds himself, he has spilled liquids.  I reviewed your office note from 01/09/22. Of note, he is on metoprolol, currently 25 mg daily.  He had blood work on 01/09/2022 and I was able to review his results through [facility], Epic. His TSH was normal at 1.22, vitamin D 69, PSA normal at 0.68, lipid  profile showed benign findings with total cholesterol of 104, triglycerides 113, HDL 29, LDL 52, CBC with differential and platelets benign, A1c 5.1, CMP with benign findings, sodium of 139, potassium 5.0, BUN 14, creatinine 0.79, glucose 87, alk phos 60, AST 31, ALT 30. He had a Head CT without contrast on 12/31/2019 for indication of headache and dizziness, blurred vision.  I reviewed the results:  IMPRESSION:  Negative CT head    He had a sleep study several years ago and was diagnosed with what sounds like mild sleep apnea, he never had a CPAP machine.  He lost weight and his snoring improved, per wife.  He sleeps fairly well but does endorse nocturia about twice, he has a variable sleep schedule, they do not sleep in the same bedroom.  His Past Medical History Is Significant For: Past Medical History:  Diagnosis Date   Arthritis    back    Chronic back pain    degenerative disc disease   Eczema    Elevated PSA    sees urologist   GERD (gastroesophageal reflux disease)    takes Protonix daily   H/O urinary frequency    Hemorrhoids    Hyperlipidemia    takes Krill Oil daily   Hypertension    Migraine    hx of;last time a couple of yrs ago   Myocardial infarct (  [place])    Pneumonia    hx of;last time about 3-82yr ago   Sinus drainage    uses Flonase nightly as well as saline spray   Sleep apnea    has not gotten report back from this    His Past Surgical History Is Significant For: Past Surgical History:  Procedure Laterality Date   BACK SURGERY     colonosocpy     KNEE ARTHROSCOPY  1995   left   NASAL SEPTOPLASTY W/ TURBINOPLASTY  12/22/2011   Procedure: NASAL SEPTOPLASTY WITH TURBINATE REDUCTION;  Surgeon: DJerrell Belfast MD;  [facility]  Service: ENT;  Laterality: Bilateral;   NASAL SEPTUM SURGERY  12/22/2011    His Family History Is Significant For: Family History  Problem Relation Age of Onset   Cancer Mother    Lung cancer Sister    Anesthesia problems Neg  Hx    Hypotension Neg Hx    Malignant hyperthermia Neg Hx    Pseudochol deficiency Neg Hx     His Social History Is Significant For: Social History   Socioeconomic History   Marital status: Married    Spouse name: Not on file   Number of children: Not on file   Years of education: Not on file   Highest education level: Not on file  Occupational History   Not on file  Tobacco Use   Smoking status: Former    Types: Cigarettes   Smokeless tobacco: Never   Tobacco comments:    quit 211yrago  Vaping Use   Vaping Use: Never used  Substance and Sexual Activity   Alcohol use: Yes    Comment: occ   Drug use: No   Sexual activity: Not on file  Other Topics Concern   Not on file  Social History Narrative   Caffeine coffee 4 cups daily   Education: [employer] Retired.    Social Determinants of Health   Financial Resource Strain: Not on file  Food Insecurity: Not on file  Transportation Needs: Not on file  Physical Activity: Not on file  Stress: Not on file  Social Connections: Not on file    His Allergies Are:  Allergies  Allergen Reactions   Statins Other (See Comments)    Muscle cramps Other reaction(s): Other (See Comments) Muscle cramps Muscle cramps Muscle cramps Other reaction(s): Other (See Comments) Muscle cramps    Pravastatin Sodium Other (See Comments)    Muscle cramps Muscle cramps   :   His Current Medications Are:  Outpatient Encounter Medications as of 02/28/2022  Medication Sig   Acetaminophen (TYLENOL PO) Take 1 tablet by mouth every 6 (six) hours as needed. For pain   aspirin 81 MG chewable tablet Chew 1 tablet by mouth daily.   ezetimibe (ZETIA) 10 MG tablet Take 1 tablet by mouth daily.   metoprolol succinate (TOPROL-XL) 25 MG 24 hr tablet Take 25 mg by mouth daily.   nitroGLYCERIN (NITROSTAT) 0.4 MG SL tablet Place 0.4 mg under the tongue every 5 (five) minutes as needed.   omeprazole (PRILOSEC) 20 MG capsule Take 1 tablet by mouth daily.    rosuvastatin (CRESTOR) 10 MG tablet Take 5 mg by mouth daily.   Vitamin D, Ergocalciferol, (DRISDOL) 1.25 MG (50000 UNIT) CAPS capsule Take 50,000 Units by mouth once a week.   [DISCONTINUED] B Complex-C (B-COMPLEX WITH VITAMIN C) tablet Take 1 tablet by mouth daily.   [DISCONTINUED] Cholecalciferol (VITAMIN D3) 3000 UNITS TABS Take 1 tablet by mouth  daily.   No facility-administered encounter medications on file as of 02/28/2022.  :   Review of Systems:  Out of a complete 14 point review of systems, all are reviewed and negative with the exception of these symptoms as listed below:  Review of Systems  Neurological:        Hand tremors , L worse then R.  Worse since COVID 2 yrs. Started about 3 yrs ago. R hand dominant.  Used to be a welder.     Objective:  Neurological Exam  Physical Exam Physical Examination:   Vitals:   02/28/22 1443  BP: 139/79  Pulse: 62    General Examination: The patient is a very pleasant 71 y.o. male in no acute distress. He appears well-developed and well-nourished and well groomed.   HEENT: Normocephalic, atraumatic, pupils are equal, round and reactive to light, extraocular tracking is good without limitation to gaze excursion or nystagmus noted. Hearing is grossly intact. Face is symmetric with normal facial animation and normal facial sensation to light touch and temperature. Speech is clear with no dysarthria noted. There is no hypophonia. There is a slight intermittent side-to-side head tremor.  Neck is supple with full range of passive and active motion. There are no carotid bruits on auscultation. Oropharynx exam reveals: mild mouth dryness, adequate dental hygiene and mild airway crowding, due to redundant soft palate, tonsillar size of about 1+.  Mallampati class II. Tongue protrudes centrally and palate elevates symmetrically.   Chest: Clear to auscultation without wheezing, rhonchi or crackles noted.  Heart: S1+S2+0, regular and normal  without murmurs, rubs or gallops noted.   Abdomen: Soft, non-tender and non-distended with normal bowel sounds appreciated on auscultation.  Extremities: There is trace pitting edema in the distal lower extremities bilaterally.   Skin: Warm and dry without trophic changes noted.  Mild erythema distal lower extremities bilaterally.  Musculoskeletal: exam reveals no obvious joint deformities, tenderness or joint swelling or erythema.  Unremarkable scars from arthroscopic knee surgery on the left.  Neurologically:  Mental status: The patient is awake, alert and oriented in all 4 spheres. His immediate and remote memory, attention, language skills and fund of knowledge are appropriate. There is no evidence of aphasia, agnosia, apraxia or anomia. Speech is clear with normal prosody and enunciation. Thought process is linear. Mood is normal and affect is normal.  Cranial nerves II - XII are as described above under HEENT exam.  Motor exam: Normal bulk, strength and tone is noted. There is no resting tremor.   On 02/28/2022: On Archimedes drawing he has mild to moderate trembling with the left hand, minimal trembling with the right hand, handwriting with the right hand is slightly tremulous, legible, not micrographic.  He has a mild postural tremor with the left upper extremity, minimal on the right, very mild action tremor left more than right, no significant intention tremor.  No lower extremity tremor.  Romberg is negative. Reflexes are 1+ in the upper extremities, trace in the knees, absent in the ankles.  Toes are downgoing bilaterally.  Fine motor skills and coordination: Normal finger taps and hand movements as well as rapid alternating patting, normal foot taps, no significant decrement in amplitude, no lateralization noted.    Cerebellar testing: No dysmetria or intention tremor. There is no truncal or gait ataxia.  Normal finger-to-nose, normal heel-to-shin with slight decrease in range of motion  with the left knee. Sensory exam: intact to light touch and temperature in the upper and lower extremities.    Gait, station and balance: He stands easily. No veering to one side is noted. No leaning to one side is noted. Posture is age-appropriate and stance is narrow based. Gait shows normal stride length and normal pace. No problems turning are noted.  Preserved arm swing noted.  Assessment and Plan:   In summary, EPHRIAM TURMAN is a very pleasant 71 y.o.-year old male with an underlying medical history of arthritis, chronic back pain, eczema, reflux disease, hypertension, hyperlipidemia, history of MI, sleep apnea, and severe obesity with a BMI of over 40, who presents for evaluation of his tremor disorder affecting the left more than right upper extremities for the past 3 years with mild progression noted.  History and examination findings are supportive of mild essential tremor.  I did not detect any evidence of parkinsonism and he was reassured in that regard.  He reports a family history of Parkinson's disease, no strong family history of essential tremor, however, mom died younger.  We talked about tremor triggers and alleviating factors.  He is advised about potential symptomatic treatment options, in fact, he is already on a beta-blocker in low-dose.  I did not suggest increasing his metoprolol at this time.  He is encouraged to stay well-hydrated and well rested and scale back on his coffee intake which may help.  We mutually agreed not to start him on any additional symptomatic treatment such as primidone.  We can consider this in the future if the need arises.  I suggested we continue to monitor his examination and symptoms.  He is advised to follow-up routinely in 6 months, sooner if needed.  He had recent blood work and I did not suggest any additional testing from my end of things.  I answered all their questions today and the patient and his wife are in agreement.   Thank you very much  for allowing me to participate in the care of this nice patient. If I can be of any further assistance to you please do not hesitate to call me at [phone].  Sincerely,   Star Age, MD, PhD

## 2022-02-28 NOTE — Patient Instructions (Signed)
You have a mild tremor of both hands, more noticeable on the left, likely a mild form of essential tremor.  I do not see any signs or symptoms of parkinson's like disease or what we call parkinsonism.  For your tremor, I would not recommend any new medications at this time.   We can see you in 6 mo for a recheck I recommend we monitor you for now.    We may consider medication for tremor in the future. Please explained, you are already on a beta-blocker currently  Please remember, that any kind of tremor may be exacerbated by anxiety, anger, nervousness, excitement, dehydration, sleep deprivation, thyroid dysfunction, by caffeine, and low blood sugar values or blood sugar fluctuations. Some medications can exacerbate tremors, this includes certain asthma or COPD medications and certain antidepressants.   Please consider scaling back on your caffeine intake.

## 2022-08-31 ENCOUNTER — Ambulatory Visit: Payer: Medicare Other | Admitting: Neurology

## 2023-01-03 ENCOUNTER — Ambulatory Visit: Payer: Medicare Other | Admitting: Neurology
